# Patient Record
Sex: Female | Born: 1975 | Race: Black or African American | Hispanic: No | Marital: Married | Smoking: Never smoker
Health system: Southern US, Community
[De-identification: ages and names within clinical notes are randomized; demographics above are authoritative.]

## PROBLEM LIST (undated history)

## (undated) DIAGNOSIS — E7800 Pure hypercholesterolemia, unspecified: Secondary | ICD-10-CM

## (undated) DIAGNOSIS — R002 Palpitations: Secondary | ICD-10-CM

## (undated) DIAGNOSIS — E282 Polycystic ovarian syndrome: Secondary | ICD-10-CM

## (undated) HISTORY — DX: Palpitations: R00.2

---

## 1999-09-28 ENCOUNTER — Other Ambulatory Visit: Admission: RE | Admit: 1999-09-28 | Discharge: 1999-09-28 | Payer: Self-pay | Admitting: *Deleted

## 2000-07-19 ENCOUNTER — Other Ambulatory Visit: Admission: RE | Admit: 2000-07-19 | Discharge: 2000-07-19 | Payer: Self-pay | Admitting: *Deleted

## 2001-06-15 ENCOUNTER — Encounter: Admission: RE | Admit: 2001-06-15 | Discharge: 2001-06-20 | Payer: Self-pay | Admitting: Family Medicine

## 2001-07-24 ENCOUNTER — Other Ambulatory Visit: Admission: RE | Admit: 2001-07-24 | Discharge: 2001-07-24 | Payer: Self-pay | Admitting: Family Medicine

## 2002-07-31 ENCOUNTER — Other Ambulatory Visit: Admission: RE | Admit: 2002-07-31 | Discharge: 2002-07-31 | Payer: Self-pay | Admitting: *Deleted

## 2004-06-22 ENCOUNTER — Other Ambulatory Visit: Admission: RE | Admit: 2004-06-22 | Discharge: 2004-06-22 | Payer: Self-pay | Admitting: Gynecology

## 2005-01-10 ENCOUNTER — Emergency Department (HOSPITAL_COMMUNITY): Admission: EM | Admit: 2005-01-10 | Discharge: 2005-01-10 | Payer: Self-pay | Admitting: Emergency Medicine

## 2005-01-23 ENCOUNTER — Ambulatory Visit (HOSPITAL_COMMUNITY): Admission: RE | Admit: 2005-01-23 | Discharge: 2005-01-23 | Payer: Self-pay | Admitting: Orthopedic Surgery

## 2006-01-20 ENCOUNTER — Other Ambulatory Visit: Admission: RE | Admit: 2006-01-20 | Discharge: 2006-01-20 | Payer: Self-pay | Admitting: Gynecology

## 2007-02-05 ENCOUNTER — Other Ambulatory Visit: Admission: RE | Admit: 2007-02-05 | Discharge: 2007-02-05 | Payer: Self-pay | Admitting: Gynecology

## 2008-01-03 DIAGNOSIS — E1169 Type 2 diabetes mellitus with other specified complication: Secondary | ICD-10-CM | POA: Insufficient documentation

## 2008-03-27 ENCOUNTER — Emergency Department (HOSPITAL_COMMUNITY): Admission: EM | Admit: 2008-03-27 | Discharge: 2008-03-27 | Payer: Self-pay | Admitting: Emergency Medicine

## 2008-05-08 ENCOUNTER — Other Ambulatory Visit: Admission: RE | Admit: 2008-05-08 | Discharge: 2008-05-08 | Payer: Self-pay | Admitting: Gynecology

## 2008-12-28 ENCOUNTER — Emergency Department (HOSPITAL_COMMUNITY): Admission: EM | Admit: 2008-12-28 | Discharge: 2008-12-28 | Payer: Self-pay | Admitting: Emergency Medicine

## 2009-01-02 ENCOUNTER — Emergency Department (HOSPITAL_COMMUNITY): Admission: EM | Admit: 2009-01-02 | Discharge: 2009-01-02 | Payer: Self-pay | Admitting: Emergency Medicine

## 2010-12-31 ENCOUNTER — Emergency Department (HOSPITAL_BASED_OUTPATIENT_CLINIC_OR_DEPARTMENT_OTHER)
Admission: EM | Admit: 2010-12-31 | Discharge: 2010-12-31 | Disposition: A | Discharge status: Home / Self Care | Payer: 59 | Attending: Emergency Medicine | Admitting: Emergency Medicine

## 2010-12-31 ENCOUNTER — Emergency Department (INDEPENDENT_AMBULATORY_CARE_PROVIDER_SITE_OTHER): Payer: 59

## 2010-12-31 DIAGNOSIS — R209 Unspecified disturbances of skin sensation: Secondary | ICD-10-CM | POA: Insufficient documentation

## 2010-12-31 DIAGNOSIS — E7800 Pure hypercholesterolemia, unspecified: Secondary | ICD-10-CM | POA: Insufficient documentation

## 2010-12-31 DIAGNOSIS — E119 Type 2 diabetes mellitus without complications: Secondary | ICD-10-CM | POA: Insufficient documentation

## 2010-12-31 DIAGNOSIS — Z79899 Other long term (current) drug therapy: Secondary | ICD-10-CM | POA: Insufficient documentation

## 2010-12-31 LAB — BASIC METABOLIC PANEL
BUN: 11 mg/dL (ref 6–23)
Calcium: 9.4 mg/dL (ref 8.4–10.5)
Chloride: 104 mEq/L (ref 96–112)
GFR calc non Af Amer: >60 mL/min (ref >60)
Glucose, Bld: 169 mg/dL — ABNORMAL HIGH (ref 70–99)

## 2011-01-01 ENCOUNTER — Ambulatory Visit (HOSPITAL_BASED_OUTPATIENT_CLINIC_OR_DEPARTMENT_OTHER)
Admit: 2011-01-01 | Discharge: 2011-01-01 | Disposition: A | Discharge status: Home / Self Care | Payer: 59 | Attending: Endocrinology | Admitting: Endocrinology

## 2011-01-01 DIAGNOSIS — R209 Unspecified disturbances of skin sensation: Secondary | ICD-10-CM | POA: Insufficient documentation

## 2011-01-01 DIAGNOSIS — G510 Bell's palsy: Secondary | ICD-10-CM | POA: Insufficient documentation

## 2011-01-01 DIAGNOSIS — E119 Type 2 diabetes mellitus without complications: Secondary | ICD-10-CM | POA: Insufficient documentation

## 2011-01-01 DIAGNOSIS — E7800 Pure hypercholesterolemia, unspecified: Secondary | ICD-10-CM | POA: Insufficient documentation

## 2011-01-01 MED ORDER — GADOPENTETATE DIMEGLUMINE 469.01 MG/ML IV SOLN
10.0000 mL | Freq: Once | INTRAVENOUS | Status: AC | PRN
  Administered 2011-01-01: 10 mL via INTRAVENOUS

## 2011-03-08 LAB — GLUCOSE, CAPILLARY: Glucose-Capillary: 86 mg/dL (ref 70–99)

## 2012-02-21 ENCOUNTER — Other Ambulatory Visit: Payer: Self-pay | Admitting: Neurology

## 2012-02-21 DIAGNOSIS — R200 Anesthesia of skin: Secondary | ICD-10-CM

## 2012-02-21 DIAGNOSIS — E785 Hyperlipidemia, unspecified: Secondary | ICD-10-CM

## 2012-02-25 ENCOUNTER — Ambulatory Visit
Admission: RE | Admit: 2012-02-25 | Discharge: 2012-02-25 | Disposition: A | Discharge status: Home / Self Care | Payer: 59 | Source: Ambulatory Visit | Attending: Neurology | Admitting: Neurology

## 2012-02-25 DIAGNOSIS — R200 Anesthesia of skin: Secondary | ICD-10-CM

## 2012-02-25 DIAGNOSIS — E785 Hyperlipidemia, unspecified: Secondary | ICD-10-CM

## 2012-07-08 ENCOUNTER — Emergency Department (HOSPITAL_BASED_OUTPATIENT_CLINIC_OR_DEPARTMENT_OTHER): Payer: 59

## 2012-07-08 ENCOUNTER — Encounter (HOSPITAL_BASED_OUTPATIENT_CLINIC_OR_DEPARTMENT_OTHER): Payer: Self-pay | Admitting: Emergency Medicine

## 2012-07-08 ENCOUNTER — Emergency Department (HOSPITAL_BASED_OUTPATIENT_CLINIC_OR_DEPARTMENT_OTHER)
Admission: EM | Admit: 2012-07-08 | Discharge: 2012-07-08 | Disposition: A | Discharge status: Home / Self Care | Payer: 59 | Attending: Emergency Medicine | Admitting: Emergency Medicine

## 2012-07-08 DIAGNOSIS — Y92009 Unspecified place in unspecified non-institutional (private) residence as the place of occurrence of the external cause: Secondary | ICD-10-CM | POA: Insufficient documentation

## 2012-07-08 DIAGNOSIS — Z794 Long term (current) use of insulin: Secondary | ICD-10-CM | POA: Insufficient documentation

## 2012-07-08 DIAGNOSIS — S90129A Contusion of unspecified lesser toe(s) without damage to nail, initial encounter: Secondary | ICD-10-CM | POA: Insufficient documentation

## 2012-07-08 DIAGNOSIS — W1809XA Striking against other object with subsequent fall, initial encounter: Secondary | ICD-10-CM | POA: Insufficient documentation

## 2012-07-08 DIAGNOSIS — E119 Type 2 diabetes mellitus without complications: Secondary | ICD-10-CM | POA: Insufficient documentation

## 2012-07-08 DIAGNOSIS — Y9301 Activity, walking, marching and hiking: Secondary | ICD-10-CM | POA: Insufficient documentation

## 2012-07-08 MED ORDER — NAPROXEN 500 MG PO TABS: 500.0000 mg | ORAL_TABLET | Freq: Two times a day (BID) | ORAL | Status: AC

## 2012-07-08 NOTE — ED Provider Notes (Signed)
History     CSN: [id number]  Arrival date & time 07/08/12  4098   First MD Initiated Contact with Patient 07/08/12 [phone]      Chief Complaint  Patient presents with  . Toe Injury    (Consider location/radiation/quality/duration/timing/severity/associated sxs/prior treatment) HPI Comments: Pt was walking up some stairs last night and tripped on one stair, hurting her left 5th toe.  Has had a constanst, throbbing pain since then.  No other injuries.  Patient is a 36 y.o. female presenting with lower extremity pain. The history is provided by the patient.  Foot Pain This is a new problem. The current episode started yesterday. The problem occurs constantly. The problem has been gradually worsening. Pertinent negatives include no abdominal pain and no shortness of breath. The symptoms are aggravated by walking. Nothing relieves the symptoms. She has tried nothing for the symptoms. The treatment provided no relief.    Past Medical History  Diagnosis Date  . Diabetes mellitus     History reviewed. No pertinent past surgical history.  No family history on file.  History  Substance Use Topics  . Smoking status: Never Smoker   . Smokeless tobacco: Not on file  . Alcohol Use: No    OB History    Grav Para Term Preterm Abortions TAB SAB Ect Mult Living                  Review of Systems  Constitutional: Negative for fever.  HENT: Negative for neck pain.   Respiratory: Negative for shortness of breath.   Gastrointestinal: Negative for abdominal pain.  Musculoskeletal: Negative for back pain.  Skin: Negative for wound.  Neurological: Negative for syncope, weakness and numbness.    Allergies  Review of patient's allergies indicates no known allergies.  Home Medications   Current Outpatient Rx  Name Route Sig Dispense Refill  . INSULIN ASPART 100 UNIT/ML [place] SOLN Subcutaneous Inject 8-10 Units into the skin 3 (three) times daily before meals.    . INSULIN GLARGINE 100  UNIT/ML [place] SOLN Subcutaneous Inject 30 Units into the skin at bedtime.    Marland Kitchen METFORMIN HCL 1000 MG PO TABS Oral Take 1,000 mg by mouth daily with breakfast.    . NAPROXEN 500 MG PO TABS Oral Take 1 tablet (500 mg total) by mouth 2 (two) times daily. 30 tablet 0    BP 146/87  Pulse 65  Temp 98.7 F (37.1 C) (Oral)  Resp 18  SpO2 100%  LMP 06/04/2012  Physical Exam  Constitutional: She appears well-developed and well-nourished.  HENT:  Head: Normocephalic and atraumatic.  Cardiovascular: Normal rate.   Pulmonary/Chest: Effort normal.  Musculoskeletal:       Mild swelling and tenderness to left 5th toe at proximal phalanx.  NV intact.  No wound.  No pain to remainder of foot/ankle  Skin: Skin is warm and dry.    ED Course  Procedures (including critical care time)  Dg Toe 5th Left  07/08/2012  *RADIOLOGY REPORT*  Clinical Data: Toe injury yesterday evening.  Swelling.  Unable to bear weight.  DG TOE 5TH LEFT  Comparison: None  Findings: There is soft tissue swelling of the fifth digit.  No evidence for acute fracture or subluxation.  No radiopaque foreign body or soft tissue gas.  IMPRESSION:  1.  Soft tissue swelling. 2. No evidence for acute osseous abnormality.  Original Report Authenticated By: Patterson Hammersmith, M.D.      1. Toe contusion  MDM  No fx seen, will buddy tape, give post op shoe.  Pt denies need for pain meds, will rx naprosyn        Rolan Bucco, MD 07/08/12 [phone]

## 2012-07-08 NOTE — ED Notes (Signed)
Pt c/o LT 5th toe pain s/p tripping up a step last pm

## 2014-01-22 ENCOUNTER — Other Ambulatory Visit (HOSPITAL_COMMUNITY): Payer: Self-pay | Admitting: Gynecology

## 2014-01-22 DIAGNOSIS — Z3141 Encounter for fertility testing: Secondary | ICD-10-CM

## 2014-01-28 ENCOUNTER — Ambulatory Visit (HOSPITAL_COMMUNITY)
Admission: RE | Admit: 2014-01-28 | Discharge: 2014-01-28 | Disposition: A | Discharge status: Home / Self Care | Payer: 59 | Source: Ambulatory Visit | Attending: Gynecology | Admitting: Gynecology

## 2014-01-28 DIAGNOSIS — Z3141 Encounter for fertility testing: Secondary | ICD-10-CM

## 2014-01-28 DIAGNOSIS — N979 Female infertility, unspecified: Secondary | ICD-10-CM | POA: Insufficient documentation

## 2014-01-28 MED ORDER — IOHEXOL 300 MG/ML  SOLN
10.0000 mL | Freq: Once | INTRAMUSCULAR | Status: AC | PRN
  Administered 2014-01-28: 10 mL

## 2014-12-22 ENCOUNTER — Encounter (HOSPITAL_BASED_OUTPATIENT_CLINIC_OR_DEPARTMENT_OTHER): Payer: Self-pay | Admitting: *Deleted

## 2014-12-22 ENCOUNTER — Emergency Department (HOSPITAL_BASED_OUTPATIENT_CLINIC_OR_DEPARTMENT_OTHER)
Admission: EM | Admit: 2014-12-22 | Discharge: 2014-12-22 | Disposition: A | Discharge status: Home / Self Care | Payer: 59 | Attending: Emergency Medicine | Admitting: Emergency Medicine

## 2014-12-22 DIAGNOSIS — Z794 Long term (current) use of insulin: Secondary | ICD-10-CM | POA: Insufficient documentation

## 2014-12-22 DIAGNOSIS — J029 Acute pharyngitis, unspecified: Secondary | ICD-10-CM | POA: Insufficient documentation

## 2014-12-22 DIAGNOSIS — Z79899 Other long term (current) drug therapy: Secondary | ICD-10-CM | POA: Insufficient documentation

## 2014-12-22 DIAGNOSIS — E119 Type 2 diabetes mellitus without complications: Secondary | ICD-10-CM | POA: Insufficient documentation

## 2014-12-22 LAB — CBG MONITORING, ED: GLUCOSE-CAPILLARY: 72 mg/dL (ref 70–99)

## 2014-12-22 NOTE — ED Provider Notes (Signed)
CSN: [id number]     Arrival date & time 12/22/14  1638 History   First MD Initiated Contact with Patient 12/22/14 1706     Chief Complaint  Patient presents with  . Sore Throat     (Consider location/radiation/quality/duration/timing/severity/associated sxs/prior Treatment) HPI Comments: Patient presents from an outside urgent care with complaint of sore throat. Patient started having a sore throat yesterday. Pain is worse on the left side of her throat. [facility] and had a negative strep test. This was sent for culture. She was referred to the emergency department to rule out peritonsillar abscess. As of her unilateral pain. Patient has not had trouble swelling or change in her voice. She has had a mild cough and a slightly runny nose. No treatments prior to arrival. Patient states that her blood sugars have been controlled. No pain with movement of her neck. No fever, nausea or vomiting.  Patient is a 39 y.o. female presenting with pharyngitis. The history is provided by the patient.  Sore Throat Associated symptoms include coughing and a sore throat. Pertinent negatives include no abdominal pain, chest pain, fever, headaches, myalgias, nausea, rash or vomiting.    Past Medical History  Diagnosis Date  . Diabetes mellitus   . Palpitations    History reviewed. No pertinent past surgical history. No family history on file. History  Substance Use Topics  . Smoking status: Never Smoker   . Smokeless tobacco: Not on file  . Alcohol Use: No   OB History    No data available     Review of Systems  Constitutional: Negative for fever.  HENT: Positive for rhinorrhea and sore throat.   Eyes: Negative for redness.  Respiratory: Positive for cough.   Cardiovascular: Negative for chest pain.  Gastrointestinal: Negative for nausea, vomiting, abdominal pain and diarrhea.  Genitourinary: Negative for dysuria.  Musculoskeletal: Negative for myalgias.  Skin:  Negative for rash.  Neurological: Negative for headaches.      Allergies  Review of patient's allergies indicates no known allergies.  Home Medications   Prior to Admission medications   Medication Sig Start Date End Date Taking? Authorizing Provider  Lorcaserin HCl (BELVIQ PO) Take by mouth.   Yes Historical Provider, MD  Cholecalciferol (VITAMIN D) 2000 UNITS CAPS Take by mouth.    Historical Provider, MD  insulin aspart (NOVOLOG) 100 UNIT/ML injection Inject 8-10 Units into the skin 3 (three) times daily before meals.    Historical Provider, MD  insulin glargine (LANTUS) 100 UNIT/ML injection Inject 30 Units into the skin at bedtime.    Historical Provider, MD  metFORMIN (GLUCOPHAGE) 1000 MG tablet Take 1,000 mg by mouth daily with breakfast.    Historical Provider, MD   BP 181/90 mmHg  Pulse 88  Temp(Src) 98.5 F (36.9 C) (Oral)  Resp 20  Ht 5\' 8"  (1.727 m)  Wt 323 lb 8 oz (146.739 kg)  BMI 49.20 kg/m2  SpO2 100%  LMP 12/08/2014 Physical Exam  Constitutional: She appears well-developed and well-nourished.  HENT:  Head: Normocephalic and atraumatic.  Right Ear: Tympanic membrane, external ear and ear canal normal.  Left Ear: Tympanic membrane, external ear and ear canal normal.  Nose: Mucosal edema present. No rhinorrhea.  Mouth/Throat: Uvula is midline and mucous membranes are normal. Mucous membranes are not dry. No oral lesions. No trismus in the jaw. No uvula swelling. Posterior oropharyngeal erythema present. No oropharyngeal exudate, posterior oropharyngeal edema or tonsillar abscesses.  Eyes: Conjunctivae are  normal. Right eye exhibits no discharge. Left eye exhibits no discharge.  Neck: Normal range of motion. Neck supple.  Cardiovascular: Normal rate, regular rhythm and normal heart sounds.   Pulmonary/Chest: Effort normal and breath sounds normal. No respiratory distress. She has no wheezes. She has no rales.  Abdominal: Soft. There is no tenderness.   Lymphadenopathy:    She has no cervical adenopathy.  Neurological: She is alert.  Skin: Skin is warm and dry.  Psychiatric: She has a normal mood and affect.  Nursing note and vitals reviewed.   ED Course  Procedures (including critical care time) Labs Review Labs Reviewed  CBG MONITORING, ED    Imaging Review No results found.   EKG Interpretation None       5:24 PM Patient seen and examined. No clinical signs of PTA. Patient to use over-the-counter medications as needed for symptom control. Patient to return with worsening pain, fever, trouble swallowing. Patient verbalizes understanding and agrees with plan.    Vital signs reviewed and are as follows: BP 181/90 mmHg  Pulse 88  Temp(Src) 98.5 F (36.9 C) (Oral)  Resp 20  Ht  (1.727 m)  Wt 323 lb 8 oz (146.739 kg)  BMI 49.20 kg/m2  SpO2 100%  LMP 12/08/2014    MDM   Final diagnoses:  Pharyngitis   Patient with no current signs of peritonsillar abscess on exam. No trouble moving neck or fever raising suspicion for retropharyngeal abscess. Signs and symptoms consistent with a viral pharyngitis. Will not recheck a strep test here. Patient appears well, nontoxic. Discharge to home with symptomatic management.   Renne Crigler, PA-C 12/22/14 1727  Arby Barrette, MD 12/28/14 [phone]

## 2014-12-22 NOTE — ED Notes (Signed)
Sore throat since last night. [facility] coming here and had a negative strep that will be sent for a throat culture. They sent her here to r/o peritonsillar abscess.

## 2014-12-22 NOTE — Discharge Instructions (Signed)
Please read and follow all provided instructions.  Your diagnoses today include:  1. Pharyngitis     Tests performed today include:  Vital signs. See below for your results today.   Medications prescribed:   None  Home care instructions:  Please read the educational materials provided and follow any instructions contained in this packet.  Follow-up instructions: Please follow-up with your primary care provider as needed for further evaluation of your symptoms.  Return instructions:   Please return to the Emergency Department if you experience worsening symptoms.   Return if you have worsening problems swallowing, your neck becomes swollen, you cannot swallow your saliva or your voice becomes muffled.   Return with high persistent fever, persistent vomiting, or if you have trouble breathing.   Please return if you have any other emergent concerns.  Additional Information:  Your vital signs today were: BP 181/90 mmHg   Pulse 88   Temp(Src) 98.5 F (36.9 C) (Oral)   Resp 20   Ht 5\' 8"  (1.727 m)   Wt 323 lb 8 oz (146.739 kg)   BMI 49.20 kg/m2   SpO2 100%   LMP 12/08/2014 If your blood pressure (BP) was elevated above 135/85 this visit, please have this repeated by your doctor within one month. --------------

## 2017-05-14 ENCOUNTER — Emergency Department (HOSPITAL_BASED_OUTPATIENT_CLINIC_OR_DEPARTMENT_OTHER)
Admission: EM | Admit: 2017-05-14 | Discharge: 2017-05-14 | Disposition: A | Discharge status: Home / Self Care | Payer: 59 | Attending: Emergency Medicine | Admitting: Emergency Medicine

## 2017-05-14 ENCOUNTER — Emergency Department (HOSPITAL_BASED_OUTPATIENT_CLINIC_OR_DEPARTMENT_OTHER): Payer: 59

## 2017-05-14 ENCOUNTER — Encounter (HOSPITAL_BASED_OUTPATIENT_CLINIC_OR_DEPARTMENT_OTHER): Payer: Self-pay | Admitting: *Deleted

## 2017-05-14 DIAGNOSIS — Z794 Long term (current) use of insulin: Secondary | ICD-10-CM | POA: Diagnosis not present

## 2017-05-14 DIAGNOSIS — Z79899 Other long term (current) drug therapy: Secondary | ICD-10-CM | POA: Diagnosis not present

## 2017-05-14 DIAGNOSIS — M25552 Pain in left hip: Secondary | ICD-10-CM | POA: Diagnosis not present

## 2017-05-14 DIAGNOSIS — E119 Type 2 diabetes mellitus without complications: Secondary | ICD-10-CM | POA: Insufficient documentation

## 2017-05-14 DIAGNOSIS — M79605 Pain in left leg: Secondary | ICD-10-CM

## 2017-05-14 HISTORY — DX: Polycystic ovarian syndrome: E28.2

## 2017-05-14 HISTORY — DX: Pure hypercholesterolemia, unspecified: E78.00

## 2017-05-14 LAB — CBC WITH DIFFERENTIAL/PLATELET
BASOS ABS: 0 10*3/uL (ref 0.0–0.1)
BASOS PCT: 0 %
EOS PCT: 1 %
Eosinophils Absolute: 0.1 10*3/uL (ref 0.0–0.7)
HEMATOCRIT: 39 % (ref 36.0–46.0)
Hemoglobin: 13.1 g/dL (ref 12.0–15.0)
LYMPHS PCT: 26 %
Lymphs Abs: 2.5 10*3/uL (ref 0.7–4.0)
MCH: 27.2 pg (ref 26.0–34.0)
MCHC: 33.6 g/dL (ref 30.0–36.0)
MCV: 80.9 fL (ref 78.0–100.0)
Monocytes Absolute: 0.6 10*3/uL (ref 0.1–1.0)
Monocytes Relative: 6 %
NEUTROS ABS: 6.5 10*3/uL (ref 1.7–7.7)
Neutrophils Relative %: 67 %
PLATELETS: 301 10*3/uL (ref 150–400)
RBC: 4.82 MIL/uL (ref 3.87–5.11)
RDW: 14.8 % (ref 11.5–15.5)
WBC: 9.8 10*3/uL (ref 4.0–10.5)

## 2017-05-14 LAB — BASIC METABOLIC PANEL
ANION GAP: 8 (ref 5–15)
BUN: 20 mg/dL (ref 6–20)
CO2: 24 mmol/L (ref 22–32)
Calcium: 9 mg/dL (ref 8.9–10.3)
Chloride: 105 mmol/L (ref 101–111)
Creatinine, Ser: 0.95 mg/dL (ref 0.44–1.00)
GFR calc Af Amer: >60 mL/min (ref >60)
Glucose, Bld: 116 mg/dL — ABNORMAL HIGH (ref 65–99)
POTASSIUM: 4.5 mmol/L (ref 3.5–5.1)
SODIUM: 137 mmol/L (ref 135–145)

## 2017-05-14 LAB — URINALYSIS, ROUTINE W REFLEX MICROSCOPIC
Bilirubin Urine: NEGATIVE
Glucose, UA: >=500 mg/dL — AB
Hgb urine dipstick: NEGATIVE
Ketones, ur: NEGATIVE mg/dL
LEUKOCYTES UA: NEGATIVE
NITRITE: NEGATIVE
PROTEIN: NEGATIVE mg/dL
Specific Gravity, Urine: 1.031 — ABNORMAL HIGH (ref 1.005–1.030)
pH: 6 (ref 5.0–8.0)

## 2017-05-14 LAB — URINALYSIS, MICROSCOPIC (REFLEX)

## 2017-05-14 LAB — PREGNANCY, URINE: PREG TEST UR: NEGATIVE

## 2017-05-14 MED ORDER — DIAZEPAM 5 MG PO TABS: 2.5000 mg | ORAL_TABLET | Freq: Two times a day (BID) | ORAL | 0 refills | Status: AC | PRN

## 2017-05-14 MED ORDER — KETOROLAC TROMETHAMINE 30 MG/ML IJ SOLN
15.0000 mg | Freq: Once | INTRAMUSCULAR | Status: AC
  Administered 2017-05-14: 15 mg via INTRAVENOUS
  Filled 2017-05-14: qty 1

## 2017-05-14 MED ORDER — FENTANYL CITRATE (PF) 100 MCG/2ML IJ SOLN
100.0000 ug | Freq: Once | INTRAMUSCULAR | Status: AC
  Administered 2017-05-14: 100 ug via INTRAVENOUS
  Filled 2017-05-14: qty 2

## 2017-05-14 MED ORDER — HYDROCODONE-ACETAMINOPHEN 5-325 MG PO TABS: 1.0000 | ORAL_TABLET | Freq: Two times a day (BID) | ORAL | 0 refills | Status: AC | PRN

## 2017-05-14 MED ORDER — HYDROCODONE-ACETAMINOPHEN 5-325 MG PO TABS
2.0000 | ORAL_TABLET | Freq: Once | ORAL | Status: AC
  Administered 2017-05-14: 2 via ORAL
  Filled 2017-05-14: qty 2

## 2017-05-14 MED ORDER — IBUPROFEN 400 MG PO TABS: 400.0000 mg | ORAL_TABLET | Freq: Four times a day (QID) | ORAL | 0 refills | Status: AC

## 2017-05-14 NOTE — ED Notes (Signed)
EDP into room 

## 2017-05-14 NOTE — ED Triage Notes (Signed)
C/o L upper antero/lateral (vastus lateralis) pain, radiates to lower lateral thigh. Rates 7/10 pain. Denies back, hip or buttocks pain. Denies weakness, numbness or tingling. Denies urinary or vaginal sx. Onset yesterday, worse this morning. Mentions chiropractic back adjustment last week. Mentions medial thigh cramping on Saturday morning. Mentions wore wedge heals recently.   Alert, NAD, calm, interactive, resps e/u, speaking in clear complete sentences, no dyspnea noted, skin W&D. [facility]

## 2017-05-14 NOTE — ED Provider Notes (Signed)
[facility] Provider Note   CSN: [id number] Arrival date & time: 05/14/17  0545     History   Chief Complaint Chief Complaint  Patient presents with  . Leg Pain    HPI Jacqueline Cohen is a 41 y.o. female.  The history is provided by the patient and the spouse.  Leg Pain   This is a new problem. The current episode started 12 to 24 hours ago. The problem occurs constantly. The problem has been gradually worsening. The pain is present in the left hip. The pain is severe. Associated symptoms include limited range of motion.  Patient reports left hip/pelvic bone pain She reports she was seen by chiropractor recently for pain in that area, she "got an adjustment" and felt improved 2 days ago she wore high heels, and the next day she had significant pain in left hip/pelvic area.  No trauma It is now so severe she can not walk She has nausea No abd pain  no back pain  No leg weakness  Past Medical History:  Diagnosis Date  . Diabetes mellitus   . Hypercholesterolemia -  . Palpitations   . PCOS (polycystic ovarian syndrome)     There are no active problems to display for this patient.   History reviewed. No pertinent surgical history.  OB History    No data available       Home Medications    Prior to Admission medications   Medication Sig Start Date End Date Taking? Authorizing Provider  Cholecalciferol (VITAMIN D) 2000 UNITS CAPS Take by mouth.    [provider]  insulin aspart (NOVOLOG) 100 UNIT/ML injection Inject 8-10 Units into the skin 3 (three) times daily before meals.    [provider]  insulin glargine (LANTUS) 100 UNIT/ML injection Inject 30 Units into the skin at bedtime.    [provider]  Lorcaserin HCl (BELVIQ PO) Take by mouth.    [provider]  metFORMIN (GLUCOPHAGE) 1000 MG tablet Take 1,000 mg by mouth daily with breakfast.    [provider]    Family History No family history on  file.  Social History Social History  Substance Use Topics  . Smoking status: Never Smoker  . Smokeless tobacco: Never Used  . Alcohol use No     Allergies   Doxycycline   Review of Systems Review of Systems  Constitutional: Negative for fever.  Gastrointestinal: Negative for abdominal pain.  Musculoskeletal: Positive for arthralgias and gait problem. Negative for back pain.  Neurological: Negative for weakness.  All other systems reviewed and are negative.    Physical Exam Updated Vital Signs BP (!) 141/82 (BP Location: Right Arm)   Pulse 70   Temp 98.3 F (36.8 C) (Oral)   Resp 18   Ht 1.727 m (5\' 8" )   Wt (!) 145.2 kg (320 lb)   LMP 12/14/2016 (Approximate) Comment: h/o PCOS  SpO2 99%   BMI 48.66 kg/m   Physical Exam CONSTITUTIONAL: Uncomfortable appearing HEAD: Normocephalic/atraumatic EYES: EOMI/PERRL ENMT: Mucous membranes moist NECK: supple no meningeal signs SPINE/BACK:entire spine nontender CV: S1/S2 noted, no murmurs/rubs/gallops noted LUNGS: Lungs are clear to auscultation bilaterally, no apparent distress ABDOMEN: obese soft, nontender, no rebound or guarding GU:no cva tenderness NEURO: Pt is awake/alert/appropriate, moves all extremitiesx4.  No facial droop.   EXTREMITIES: pulses normal/equal in bilateral LE, exquisite tenderness elicited with ROM of left hip.  No deformities. Tenderness to deep palpation of left inguinal region, no erythema/abscess noted.  No  thrill noted.  Female nurse Chanin present for exam  SKIN: warm, color normal PSYCH: no abnormalities of mood noted, alert and oriented to situation   ED Treatments / Results  Labs (all labs ordered are listed, but only abnormal results are displayed) Labs Reviewed  URINALYSIS, ROUTINE W REFLEX MICROSCOPIC - Abnormal; Notable for the following:       Result Value   Specific Gravity, Urine 1.031 (*)    Glucose, UA >=500 (*)    All other components within normal limits  URINALYSIS,  MICROSCOPIC (REFLEX) - Abnormal; Notable for the following:    Bacteria, UA MANY (*)    Squamous Epithelial / LPF 6-30 (*)    All other components within normal limits  PREGNANCY, URINE  BASIC METABOLIC PANEL  CBC WITH DIFFERENTIAL/PLATELET    EKG  EKG Interpretation None       Radiology No results found.  Procedures Procedures (including critical care time)  Medications Ordered in ED Medications  fentaNYL (SUBLIMAZE) injection 100 mcg (100 mcg Intravenous Given 05/14/17 0727)     Initial Impression / Assessment and Plan / ED Course  I have reviewed the triage vital signs and the nursing notes.  Pertinent labs & imaging results that were available during my care of the patient were reviewed by me and considered in my medical decision making (see chart for details).     7:30 AM Pt presents with atraumatic left hip pain  She has significant tenderness with ROM of left hip Due to her body size, exam was very difficult to distinguish between abdominal/pelvic pain vs. Bony tenderness Initial labs/imaging ordered 7:41 AM Pt with some improvement in pain ,but now reports dizziness due to meds Labs/imaging pending At signout to dr Clayborne Danamesner, f/u on labs/imaging If pt improved/ambulatory she may be discharged If any difficulty walking she may need further testing such as CT imaging   Final Clinical Impressions(s) / ED Diagnoses   Final diagnoses:  Left hip pain    New Prescriptions New Prescriptions   No medications on file     Zadie RhineWickline, Alexa Golebiewski, MD 05/14/17 [phone]

## 2019-01-16 IMAGING — CR DG HIP (WITH OR WITHOUT PELVIS) 2-3V*L*
3 series · 3 of 3 positions shown · non-contrast
Comparison: None.

CLINICAL DATA: preg test C/o L upper antero/lateral (vastus
lateralis) pain, radiates to lower lateral thigh. Rates [DATE] pain.
Denies back, hip or buttocks pain. Denies weakness, numbness or
tingling. Denies urinary or vaginal sx.

EXAM:
DG HIP (WITH OR WITHOUT PELVIS) 2-3V LEFT

[t pelvis a.p. *]
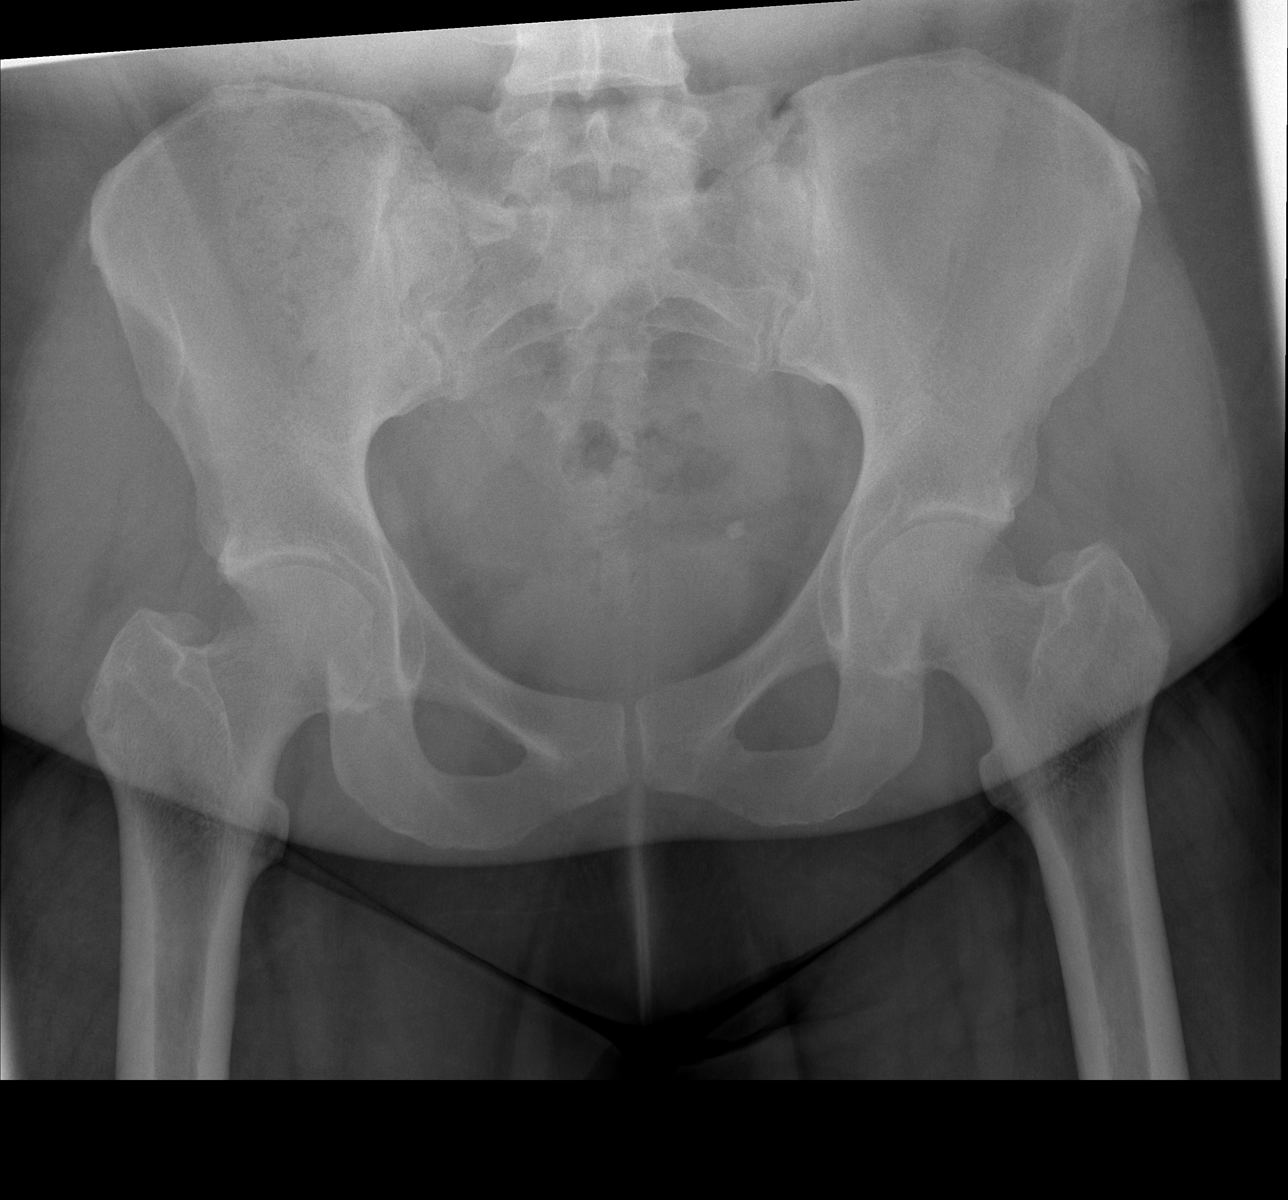

[t hip ap left *]
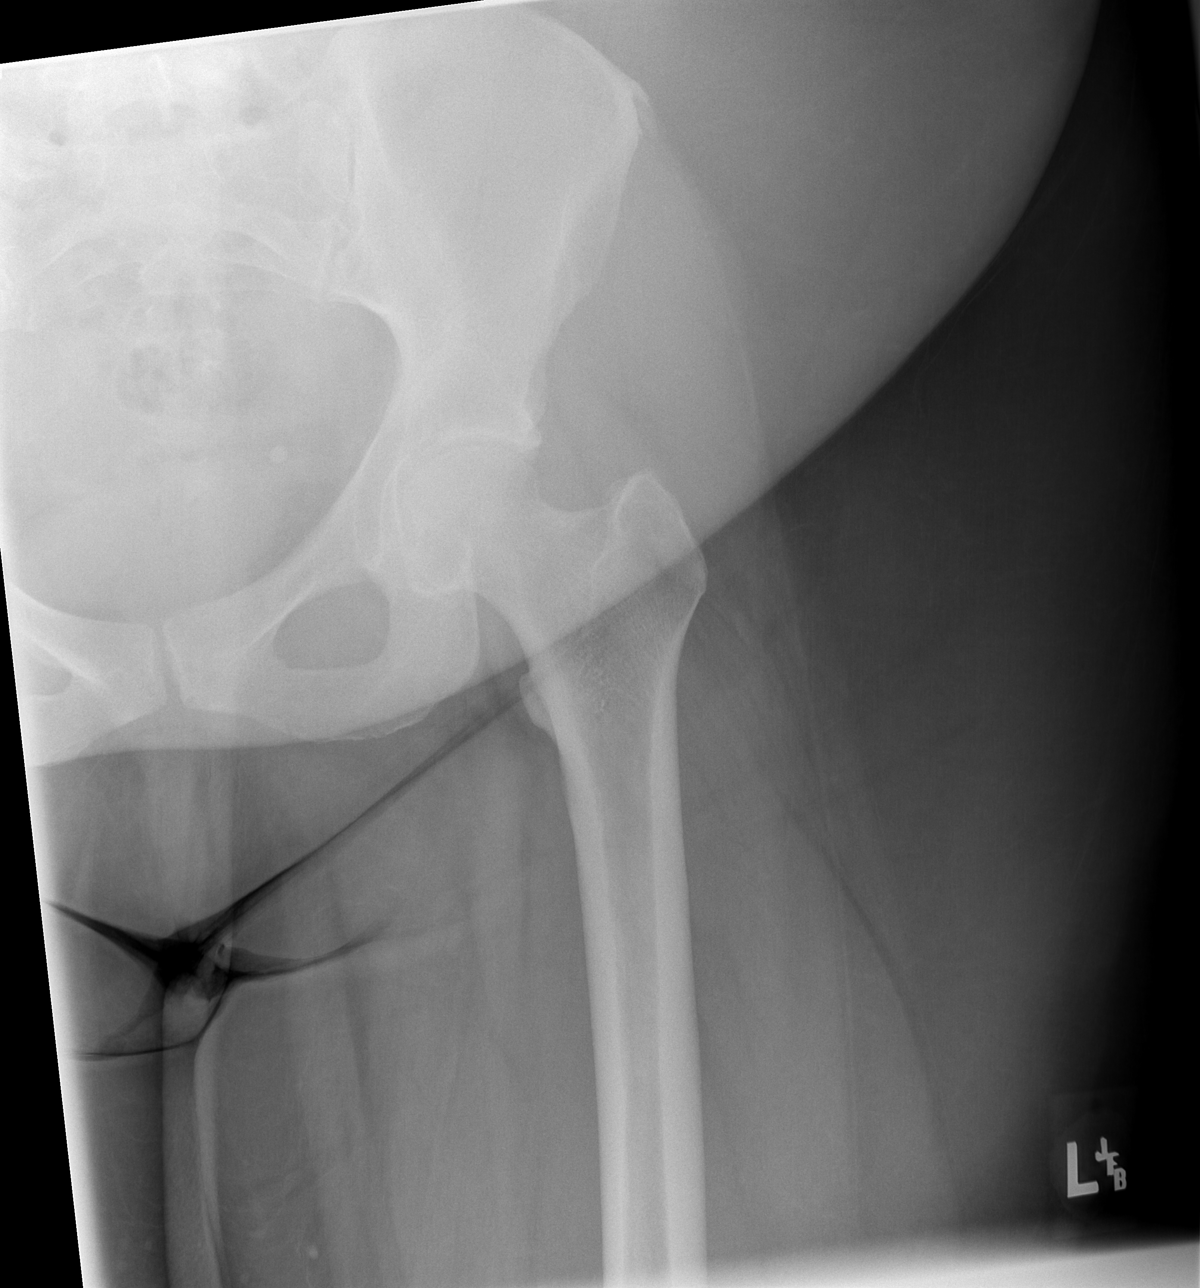

[t hip frog leg left *]
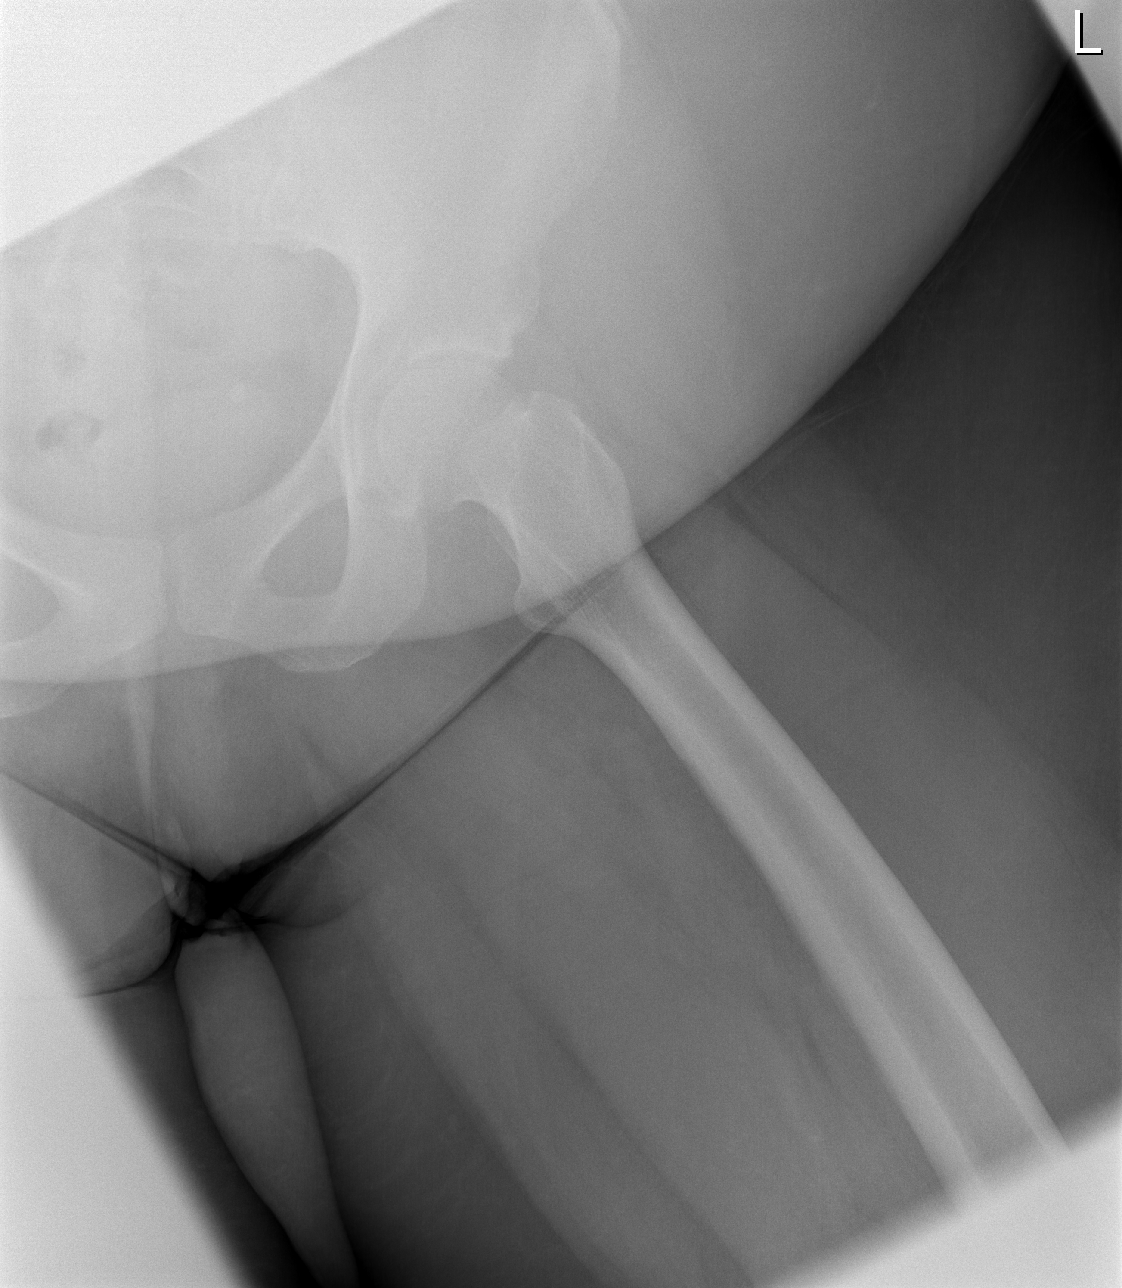

[3 of 3 positions shown; findings below may reference images not displayed]

FINDINGS: Hips are located. No evidence of pelvic fracture or sacral fracture.
Dedicated view of the LEFT hip demonstrates no femoral neck
fracture.
IMPRESSION: No fracture or dislocation.

## 2022-03-08 DIAGNOSIS — E669 Obesity, unspecified: Secondary | ICD-10-CM | POA: Insufficient documentation

## 2022-03-08 DIAGNOSIS — E7800 Pure hypercholesterolemia, unspecified: Secondary | ICD-10-CM | POA: Insufficient documentation

## 2022-06-22 ENCOUNTER — Ambulatory Visit: Payer: 59 | Attending: Orthopedic Surgery | Admitting: Physical Therapy

## 2022-06-22 ENCOUNTER — Encounter: Payer: Self-pay | Admitting: Physical Therapy

## 2022-06-22 DIAGNOSIS — R600 Localized edema: Secondary | ICD-10-CM | POA: Insufficient documentation

## 2022-06-22 DIAGNOSIS — R2689 Other abnormalities of gait and mobility: Secondary | ICD-10-CM | POA: Insufficient documentation

## 2022-06-22 DIAGNOSIS — R262 Difficulty in walking, not elsewhere classified: Secondary | ICD-10-CM | POA: Insufficient documentation

## 2022-06-22 DIAGNOSIS — M6281 Muscle weakness (generalized): Secondary | ICD-10-CM | POA: Insufficient documentation

## 2022-06-22 DIAGNOSIS — R278 Other lack of coordination: Secondary | ICD-10-CM | POA: Insufficient documentation

## 2022-06-22 NOTE — Therapy (Signed)
OUTPATIENT PHYSICAL THERAPY LOWER EXTREMITY EVALUATION   Patient Name: Jacqueline Cohen MRN: [id number] DOB:03/16/1976, 46 y.o., female Today's Date: 06/22/2022   PT End of Session - 06/22/22 1649     Visit Number 1    Date for PT Re-Evaluation 08/31/22    PT Start Time 1547    PT Stop Time 1628    PT Time Calculation (min) 41 min    Activity Tolerance Patient tolerated treatment well    Behavior During Therapy [facility] for tasks assessed/performed             Past Medical History:  Diagnosis Date   Diabetes mellitus    Hypercholesterolemia -   Palpitations    PCOS (polycystic ovarian syndrome)    History reviewed. No pertinent surgical history. There are no problems to display for this patient.   PCP: [facility]  REFERRING PROVIDER: Daws, Harlow Mares, MD   REFERRING DIAG: Achilles Tendonosis   THERAPY DIAG:  Muscle weakness (generalized)  Difficulty in walking, not elsewhere classified  Localized edema  Other lack of coordination  Other abnormalities of gait and mobility  Rationale for Evaluation and Treatment Rehabilitation  ONSET DATE: 06/02/2022   SUBJECTIVE:   SUBJECTIVE STATEMENT: Pain started 4/23 after walking on a treadmill on an incline. She tried a shorter boot, but in June she was issued a taller boot which she has worn since then.  PERTINENT HISTORY: DM  PAIN:  Are you having pain? Yes: NPRS scale: 7/10 Pain location: L achilles Pain description: stretching Aggravating factors: first thing in the morning Relieving factors: warms up , also icing helps  PRECAUTIONS: None  WEIGHT BEARING RESTRICTIONS No  FALLS:  Has patient fallen in last 6 months? 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  PLOF: Independent  PATIENT GOALS Get out of the boot, return to her normal self.   OBJECTIVE:   DIAGNOSTIC FINDINGS: Xray 03/08/22-No acute osseous abnormalities  PATIENT SURVEYS:  FOTO 43.8  COGNITION:  Overall cognitive status: Within functional limits for tasks assessed     SENSATION: Not tested  POSTURE: No Significant postural limitations  PALPATION: Patient was TTP along entire calf, with increased discomfort at distal achilles tendon to distal/posterior calcaneus.   LOWER EXTREMITY ROM:  Passive ROM Right eval Left eval  Hip flexion    Hip extension    Hip abduction    Hip adduction    Hip internal rotation    Hip external rotation    Knee flexion    Knee extension    Ankle dorsiflexion  AROM 80 90 PROM  Ankle plantarflexion  38/30  Ankle inversion  WFL  Ankle eversion  WFL   (Blank rows = not tested)  LOWER EXTREMITY MMT: WNL BLE except L ankle approximately (2+)-(3-)/5 in all planes   GAIT: Distance walked: 79' Assistive device utilized: None Level of assistance: Complete Independence Comments: Wearing CAM Boot on L. Reports L hip pain, limited gait due to boot.    TODAY'S TREATMENT: Paitent education, AROM or L ankle in all planes   PATIENT EDUCATION:  Education details: Basic HEP, POC, begin to wean from the boot, starting in the house only, wearing tennis shoes. Progress as tolerated. Person educated: Patient Education method: Explanation, Demonstration, and  Verbal cues Education comprehension: verbalized understanding and returned demonstration   HOME EXERCISE PROGRAM:  [id number]  ASSESSMENT:  CLINICAL IMPRESSION: Patient is a 46 y.o. who was seen today for physical therapy evaluation and treatment for L achilles tenodesis. She has been wearing a CAM boot for nearly 2 months. She has a follow up appointment scheduled with her Dr on 8/17. 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 She will benfit from PT to address her pain, weakness, and stiffness in order to return to her PLOF.   OBJECTIVE IMPAIRMENTS Abnormal gait, decreased balance, decreased coordination, decreased endurance, decreased mobility, difficulty walking, decreased ROM, decreased strength, increased fascial restrictions, impaired flexibility, and pain.   ACTIVITY LIMITATIONS bending, standing, squatting, sleeping, stairs, locomotion level, and caring for others  PARTICIPATION LIMITATIONS: cleaning, laundry, shopping, community activity, occupation, and yard work  PERSONAL FACTORS Past/current experiences and 1 comorbidity: DM  are also affecting patient's functional outcome.   REHAB POTENTIAL: Good  CLINICAL DECISION MAKING: Evolving/moderate complexity  EVALUATION COMPLEXITY: Moderate   GOALS: Goals reviewed with patient? Yes  SHORT TERM GOALS: Target date: 07/20/2022  I with basic HEP Baseline: Goal status: INITIAL  LONG TERM GOALS: Target date: 08/31/2022   I with final HEP Baseline:  Goal status: INITIAL  2.  Patient will achieve full, painfree A and PROM in L ankle. Baseline:  Goal status: INITIAL  3.  Patient will achieve L ankle strength of at least 4/5 in all planes. Baseline:  Goal status: INITIAL  4.  Patient will walk at least 1000' on level and unlevel surfaces with L achilles pain of <3/10 Baseline:  Goal status: INITIAL  5.  Patient will tolerate a full day of work with L achilles pain < 3/10 Baseline:  Goal status: INITIAL  6. Patient will climb up and down at least 12 steps using step over step with U rail, MI, L achilles pain < 3/10 Baseline:  Goal Status: INITIAL   7. Increase FOTO score to at least 65  Baseline:  Goal Status: INITIAL PLAN: PT FREQUENCY: 1-2x/week  PT DURATION: 10 weeks  PLANNED INTERVENTIONS: Therapeutic exercises, Therapeutic activity, Neuromuscular re-education,  Balance training, Gait training, Patient/Family education, Self Care, Joint mobilization, Stair training, Dry Needling, Electrical stimulation, Cryotherapy, Moist heat, Vasopneumatic device, Ultrasound, Ionotophoresis 4mg /ml Dexamethasone, and Manual therapy  PLAN FOR NEXT SESSION: STM, stretch, strengthening, update Hep to include resisted strengthening.   , DPT 06/22/2022, 4:51 PM

## 2022-07-04 ENCOUNTER — Ambulatory Visit: Payer: 59 | Admitting: Physical Therapy

## 2022-07-04 ENCOUNTER — Encounter: Payer: Self-pay | Admitting: Physical Therapy

## 2022-07-04 DIAGNOSIS — M6281 Muscle weakness (generalized): Secondary | ICD-10-CM | POA: Diagnosis not present

## 2022-07-04 DIAGNOSIS — R262 Difficulty in walking, not elsewhere classified: Secondary | ICD-10-CM

## 2022-07-04 DIAGNOSIS — R278 Other lack of coordination: Secondary | ICD-10-CM

## 2022-07-04 DIAGNOSIS — R2689 Other abnormalities of gait and mobility: Secondary | ICD-10-CM

## 2022-07-04 DIAGNOSIS — R600 Localized edema: Secondary | ICD-10-CM

## 2022-07-04 NOTE — Therapy (Signed)
OUTPATIENT PHYSICAL THERAPY LOWER EXTREMITY EVALUATION   Patient Name: Jacqueline Cohen MRN: [id number] DOB:07/22/1976, 46 y.o., female Today's Date: 07/04/2022   PT End of Session - 07/04/22 1722     Visit Number 2    Date for PT Re-Evaluation 08/31/22    PT Start Time 1717    PT Stop Time 1756    PT Time Calculation (min) 39 min    Activity Tolerance Patient tolerated treatment well    Behavior During Therapy WFL for tasks assessed/performed              Past Medical History:  Diagnosis Date   Diabetes mellitus    Hypercholesterolemia -   Palpitations    PCOS (polycystic ovarian syndrome)    History reviewed. No pertinent surgical history. There are no problems to display for this patient.   PCP: [facility]  REFERRING PROVIDER: Daws, Harlow Mares, MD   REFERRING DIAG: Achilles Tendonosis   THERAPY DIAG:  Muscle weakness (generalized)  Difficulty in walking, not elsewhere classified  Localized edema  Other abnormalities of gait and mobility  Other lack of coordination  Rationale for Evaluation and Treatment Rehabilitation  ONSET DATE: 06/02/2022   SUBJECTIVE:   SUBJECTIVE STATEMENT: Patient reports she has had some calf cramping with her HEP, but able to work through it. She is weaning off of the CAM Boot. She wears tennis shoes at home, and puts the boot back on if she starts getting some burning. Max pain of 4/10  PERTINENT HISTORY: DM  PAIN:  Are you having pain? Yes: NPRS scale: 7/10 Pain location: L achilles Pain description: stretching Aggravating factors: first thing in the morning Relieving factors: warms up , also icing helps  PRECAUTIONS: None  WEIGHT BEARING RESTRICTIONS No  FALLS:  Has patient fallen in last 6 months? 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  PLOF: Independent  PATIENT GOALS Get out of the boot, return to her normal self.   OBJECTIVE:   DIAGNOSTIC FINDINGS: Xray 03/08/22-No acute osseous abnormalities  PATIENT SURVEYS:  FOTO 43.8  COGNITION:  Overall cognitive status: Within functional limits for tasks assessed     SENSATION: Not tested  POSTURE: No Significant postural limitations  PALPATION: Patient was TTP along entire calf, with increased discomfort at distal achilles tendon to distal/posterior calcaneus.   LOWER EXTREMITY ROM:  Passive ROM Right eval Left eval  Hip flexion    Hip extension    Hip abduction    Hip adduction    Hip internal rotation    Hip external rotation    Knee flexion    Knee extension    Ankle dorsiflexion  AROM 80 90 PROM  Ankle plantarflexion  38/30  Ankle inversion  WFL  Ankle eversion  WFL   (Blank rows = not tested)  LOWER EXTREMITY MMT: WNL BLE except L ankle approximately (2+)-(3-)/5 in all planes   GAIT: Distance walked: 26' Assistive device utilized: None Level of assistance: Complete Independence Comments: Wearing CAM Boot on L. Reports L hip pain, limited gait due to boot.    TODAY'S TREATMENT: 07/04/22 NuStep L5 x 6 minutes STM to L calf and achilles tendon followed by stretch to DF, toe flexion and ext, calcaneal inversion Active ankle strengthening-DF,  PF, Inver, Ever 10 reps each against Red Tband resistance Ankle inversion eversion on a towel on the floor. 10 reps  Eval Patient education, AROM or L ankle in all planes   PATIENT EDUCATION:  Education details: Basic HEP, POC, begin to wean from the boot, starting in the house only, wearing tennis shoes. Progress as tolerated. Person educated: Patient Education method: Explanation, Demonstration, and Verbal cues Education comprehension: verbalized understanding and returned  demonstration   HOME EXERCISE PROGRAM:  [id number]  ASSESSMENT:  CLINICAL IMPRESSION: Patient reports improved mobility in the ankle. Treatment focused on initiating strengthening for the ankle and foot along with STM and stretch. She toelrated new exercises, encouraged to ice ankle as needed after treatment.   OBJECTIVE IMPAIRMENTS Abnormal gait, decreased balance, decreased coordination, decreased endurance, decreased mobility, difficulty walking, decreased ROM, decreased strength, increased fascial restrictions, impaired flexibility, and pain.   ACTIVITY LIMITATIONS bending, standing, squatting, sleeping, stairs, locomotion level, and caring for others  PARTICIPATION LIMITATIONS: cleaning, laundry, shopping, community activity, occupation, and yard work  PERSONAL FACTORS Past/current experiences and 1 comorbidity: DM  are also affecting patient's functional outcome.   REHAB POTENTIAL: Good  CLINICAL DECISION MAKING: Evolving/moderate complexity  EVALUATION COMPLEXITY: Moderate   GOALS: Goals reviewed with patient? Yes  SHORT TERM GOALS: Target date: 07/20/2022  I with basic HEP Baseline: Goal status: INITIAL  LONG TERM GOALS: Target date: 08/31/2022   I with final HEP Baseline:  Goal status: INITIAL  2.  Patient will achieve full, painfree A and PROM in L ankle. Baseline:  Goal status: INITIAL  3.  Patient will achieve L ankle strength of at least 4/5 in all planes. Baseline:  Goal status: INITIAL  4.  Patient will walk at least 1000' on level and unlevel surfaces with L achilles pain of <3/10 Baseline:  Goal status: INITIAL  5.  Patient will tolerate a full day of work with L achilles pain < 3/10 Baseline:  Goal status: INITIAL  6. Patient will climb up and down at least 12 steps using step over step with U rail, MI, L achilles pain < 3/10 Baseline:  Goal Status: INITIAL   7. Increase FOTO score to at least 65  Baseline:  Goal Status: INITIAL PLAN: PT  FREQUENCY: 1-2x/week  PT DURATION: 10 weeks  PLANNED INTERVENTIONS: Therapeutic exercises, Therapeutic activity, Neuromuscular re-education, Balance training, Gait training, Patient/Family education, Self Care, Joint mobilization, Stair training, Dry Needling, Electrical stimulation, Cryotherapy, Moist heat, Vasopneumatic device, Ultrasound, Ionotophoresis 4mg /ml Dexamethasone, and Manual therapy  PLAN FOR NEXT SESSION: STM, stretch, strengthening, update Hep to include resisted strengthening.   , DPT 07/04/2022, 5:55 PM

## 2022-07-06 ENCOUNTER — Ambulatory Visit: Payer: 59 | Admitting: Physical Therapy

## 2022-07-06 ENCOUNTER — Encounter: Payer: Self-pay | Admitting: Physical Therapy

## 2022-07-06 DIAGNOSIS — M6281 Muscle weakness (generalized): Secondary | ICD-10-CM | POA: Diagnosis not present

## 2022-07-06 DIAGNOSIS — R600 Localized edema: Secondary | ICD-10-CM

## 2022-07-06 DIAGNOSIS — R262 Difficulty in walking, not elsewhere classified: Secondary | ICD-10-CM

## 2022-07-06 NOTE — Therapy (Signed)
OUTPATIENT PHYSICAL THERAPY LOWER EXTREMITY EVALUATION   Patient Name: Jacqueline Cohen MRN: [id number] DOB:17-May-1976, 46 y.o., female Today's Date: 07/06/2022   PT End of Session - 07/06/22 0932     Visit Number 3    Date for PT Re-Evaluation 08/31/22    PT Start Time 0930    PT Stop Time 1015    PT Time Calculation (min) 45 min    Activity Tolerance Patient tolerated treatment well    Behavior During Therapy [facility] for tasks assessed/performed              Past Medical History:  Diagnosis Date   Diabetes mellitus    Hypercholesterolemia -   Palpitations    PCOS (polycystic ovarian syndrome)    History reviewed. No pertinent surgical history. There are no problems to display for this patient.   [facility]P: [facility]  REFERRING PROVIDER: Daws, Harlow Mares, MD   REFERRING DIAG: Achilles Tendonosis   THERAPY DIAG:  Muscle weakness (generalized)  Difficulty in walking, not elsewhere classified  Localized edema  Rationale for Evaluation and Treatment Rehabilitation  ONSET DATE: 06/02/2022   SUBJECTIVE:   SUBJECTIVE STATEMENT: "I am good" Hurt to put pressure on her ankle this morning but that has improved  PERTINENT HISTORY: DM  PAIN:  Are you having pain? Yes: NPRS scale: 3/10 Pain location: L achilles Pain description: stretching Aggravating factors: first thing in the morning Relieving factors: warms up , also icing helps  FALLS:  Has patient fallen in last 6 months? 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  PLOF: Independent  PATIENT GOALS Get out of the boot, return to her normal self.   OBJECTIVE:   DIAGNOSTIC FINDINGS: Xray 03/08/22-No acute osseous  abnormalities  PATIENT SURVEYS:  FOTO 43.8     POSTURE: No Significant postural limitations  PALPATION: Patient was TTP along entire calf, with increased discomfort at distal achilles tendon to distal/posterior calcaneus.   LOWER EXTREMITY ROM:  Passive ROM Right eval Left eval  Hip flexion    Hip extension    Hip abduction    Hip adduction    Hip internal rotation    Hip external rotation    Knee flexion    Knee extension    Ankle dorsiflexion  AROM 80 90 PROM  Ankle plantarflexion  38/30  Ankle inversion  WFL  Ankle eversion  WFL   (Blank rows = not tested)  LOWER EXTREMITY MMT: WNL BLE except L ankle approximately (2+)-(3-)/5 in all planes   GAIT: Distance walked: 62' Assistive device utilized: None Level of assistance: Complete Independence Comments: Wearing CAM Boot on L. Reports L hip pain, limited gait due to boot.    TODAY'S TREATMENT: 07/06/2022 Recumbent bike L2.5 x 6 min  L ankle PROM in all directions L ankle 4 way green x10 S2S 2x10  Resisted gait forward 20lb x3 ankle burning  Alt box taps 4in 2x10  Side step   Ionto Medial L ankle 4 hour patch   07/04/22 NuStep L5 x 6 minutes STM to L calf and achilles tendon followed by stretch to DF, toe flexion and ext, calcaneal inversion Active ankle strengthening-DF, PF, Inver, Ever 10 reps each against Red Tband resistance Ankle inversion eversion on a towel on  the floor. 10 reps  Eval Patient education, AROM or L ankle in all planes   PATIENT EDUCATION:  Education details: Basic HEP, POC, begin to wean from the boot, starting in the house only, wearing tennis shoes. Progress as tolerated. Person educated: Patient Education method: Explanation, Demonstration, and Verbal cues Education comprehension: verbalized understanding and returned demonstration   HOME EXERCISE PROGRAM:  [id number]  ASSESSMENT:  CLINICAL IMPRESSION: Patient reports improved mobility in the ankle. Treatment focused a  progression of strength. Some L ankle burning reported after resisted gait. Pt has some facial expression with resisted eversion. Cues for equal weight distribution needed with sit to stands. Ionto for pain.   OBJECTIVE IMPAIRMENTS Abnormal gait, decreased balance, decreased coordination, decreased endurance, decreased mobility, difficulty walking, decreased ROM, decreased strength, increased fascial restrictions, impaired flexibility, and pain.   ACTIVITY LIMITATIONS bending, standing, squatting, sleeping, stairs, locomotion level, and caring for others  PARTICIPATION LIMITATIONS: cleaning, laundry, shopping, community activity, occupation, and yard work  PERSONAL FACTORS Past/current experiences and 1 comorbidity: DM  are also affecting patient's functional outcome.   REHAB POTENTIAL: Good  CLINICAL DECISION MAKING: Evolving/moderate complexity  EVALUATION COMPLEXITY: Moderate   GOALS: Goals reviewed with patient? Yes  SHORT TERM GOALS: Target date: 07/20/2022  I with basic HEP Baseline: Goal status: INITIAL  LONG TERM GOALS: Target date: 08/31/2022   I with final HEP Baseline:  Goal status: INITIAL  2.  Patient will achieve full, painfree A and PROM in L ankle. Baseline:  Goal status: INITIAL  3.  Patient will achieve L ankle strength of at least 4/5 in all planes. Baseline:  Goal status: INITIAL  4.  Patient will walk at least 1000' on level and unlevel surfaces with L achilles pain of <3/10 Baseline:  Goal status: INITIAL  5.  Patient will tolerate a full day of work with L achilles pain < 3/10 Baseline:  Goal status: INITIAL  6. Patient will climb up and down at least 12 steps using step over step with U rail, MI, L achilles pain < 3/10 Baseline:  Goal Status: INITIAL   7. Increase FOTO score to at least 65  Baseline:  Goal Status: INITIAL PLAN: PT FREQUENCY: 1-2x/week  PT DURATION: 10 weeks  PLANNED INTERVENTIONS: Therapeutic exercises, Therapeutic  activity, Neuromuscular re-education, Balance training, Gait training, Patient/Family education, Self Care, Joint mobilization, Stair training, Dry Needling, Electrical stimulation, Cryotherapy, Moist heat, Vasopneumatic device, Ultrasound, Ionotophoresis 4mg /ml Dexamethasone, and Manual therapy  PLAN FOR NEXT SESSION: STM, stretch, strengthening, update Hep to include resisted strengthening.   , DPT 07/06/2022, 9:33 AM

## 2022-07-13 ENCOUNTER — Encounter: Payer: Self-pay | Admitting: Physical Therapy

## 2022-07-13 ENCOUNTER — Ambulatory Visit: Payer: 59 | Admitting: Physical Therapy

## 2022-07-13 DIAGNOSIS — R600 Localized edema: Secondary | ICD-10-CM

## 2022-07-13 DIAGNOSIS — R2689 Other abnormalities of gait and mobility: Secondary | ICD-10-CM

## 2022-07-13 DIAGNOSIS — M6281 Muscle weakness (generalized): Secondary | ICD-10-CM | POA: Diagnosis not present

## 2022-07-13 DIAGNOSIS — R262 Difficulty in walking, not elsewhere classified: Secondary | ICD-10-CM

## 2022-07-13 DIAGNOSIS — R278 Other lack of coordination: Secondary | ICD-10-CM

## 2022-07-13 NOTE — Therapy (Signed)
OUTPATIENT PHYSICAL THERAPY LOWER EXTREMITY EVALUATION   Patient Name: Jacqueline Cohen MRN: [id number] DOB:1976-03-03, 46 y.o., female Today's Date: 07/13/2022   PT End of Session - 07/13/22 1032     Visit Number 4    Date for PT Re-Evaluation 08/31/22    PT Start Time 1015    PT Stop Time 1057    PT Time Calculation (min) 42 min    Activity Tolerance Patient tolerated treatment well    Behavior During Therapy WFL for tasks assessed/performed               Past Medical History:  Diagnosis Date   Diabetes mellitus    Hypercholesterolemia -   Palpitations    PCOS (polycystic ovarian syndrome)    History reviewed. No pertinent surgical history. There are no problems to display for this patient.   [facility]P: [facility]  REFERRING PROVIDER: Daws, Harlow Mares, MD   REFERRING DIAG: Achilles Tendonosis   THERAPY DIAG:  Muscle weakness (generalized)  Difficulty in walking, not elsewhere classified  Localized edema  Other abnormalities of gait and mobility  Other lack of coordination  Rationale for Evaluation and Treatment Rehabilitation  ONSET DATE: 06/02/2022   SUBJECTIVE:   SUBJECTIVE STATEMENT: I am doing good, no major changes. Resisted walking gave me a lot of burning and pain, but it felt OK. I get relief from the patch when its on but after it comes off the pain is back, ice helps more usually.   PERTINENT HISTORY: DM  PAIN:  Are you having pain? Yes: NPRS scale: 0/10 Pain location: L achilles Pain description: stretching Aggravating factors: first thing in the morning Relieving factors: warms up , also icing helps  FALLS:  Has patient fallen in last 6 months? 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  PLOF: Independent  PATIENT GOALS Get out of the boot, return to her normal self.   OBJECTIVE:   DIAGNOSTIC FINDINGS: Xray 03/08/22-No acute osseous abnormalities  PATIENT SURVEYS:  FOTO 43.8     POSTURE: No Significant postural limitations  PALPATION: Patient was TTP along entire calf, with increased discomfort at distal achilles tendon to distal/posterior calcaneus.   LOWER EXTREMITY ROM:  Passive ROM Right eval Left eval  Hip flexion    Hip extension    Hip abduction    Hip adduction    Hip internal rotation    Hip external rotation    Knee flexion    Knee extension    Ankle dorsiflexion  AROM 80 90 PROM  Ankle plantarflexion  38/30  Ankle inversion  WFL  Ankle eversion  WFL   (Blank rows = not tested)  LOWER EXTREMITY MMT: WNL BLE except L ankle approximately (2+)-(3-)/5 in all planes   GAIT: Distance walked: 45' Assistive device utilized: None Level of assistance: Complete Independence Comments: Wearing CAM Boot on L. Reports L hip pain, limited gait due to boot.    TODAY'S TREATMENT:  07/13/22  TherEx  4 way ankle strengthening green TB x15 each  Ankle alphabet x3 L LE  BAPS board x15 CW/CCW  SLS on blue foam pad 10x10 seconds  Heel walking in // bars x2 full laps Toe walking in // bars x2 full laps  Heel-toe walking with  blue foam pad under L LE x10    Ionto patch 4 hours medial L ankle       07/06/2022 Recumbent bike L2.5 x 6 min  L ankle PROM in all directions L ankle 4 way green x10 S2S 2x10  Resisted gait forward 20lb x3 ankle burning  Alt box taps 4in 2x10  Side step   Ionto Medial L ankle 4 hour patch   07/04/22 NuStep L5 x 6 minutes STM to L calf and achilles tendon followed by stretch to DF, toe flexion and ext, calcaneal inversion Active ankle strengthening-DF, PF, Inver, Ever 10 reps each against Red Tband resistance Ankle inversion eversion on a towel on the floor. 10  reps  Eval Patient education, AROM or L ankle in all planes   PATIENT EDUCATION:  Education details: Basic HEP, POC, begin to wean from the boot, starting in the house only, wearing tennis shoes. Progress as tolerated. Person educated: Patient Education method: Explanation, Demonstration, and Verbal cues Education comprehension: verbalized understanding and returned demonstration   HOME EXERCISE PROGRAM:  [id number]  ASSESSMENT:  CLINICAL IMPRESSION:  Jacqueline Cohen arrives doing OK today, sees her ortho tomorrow and eager to get out of her boot- encouraged respecting healing time and feedback from her body. Continued working on strength and ROM today and closed chain strengthening as able. Still having some stiffness in her ankle which is to be expected still using the CAM boot. Hopefully MD will be happy with her progress.    OBJECTIVE IMPAIRMENTS Abnormal gait, decreased balance, decreased coordination, decreased endurance, decreased mobility, difficulty walking, decreased ROM, decreased strength, increased fascial restrictions, impaired flexibility, and pain.   ACTIVITY LIMITATIONS bending, standing, squatting, sleeping, stairs, locomotion level, and caring for others  PARTICIPATION LIMITATIONS: cleaning, laundry, shopping, community activity, occupation, and yard work  PERSONAL FACTORS Past/current experiences and 1 comorbidity: DM  are also affecting patient's functional outcome.   REHAB POTENTIAL: Good  CLINICAL DECISION MAKING: Evolving/moderate complexity  EVALUATION COMPLEXITY: Moderate   GOALS: Goals reviewed with patient? Yes  SHORT TERM GOALS: Target date: 07/20/2022  I with basic HEP Baseline: Goal status: INITIAL  LONG TERM GOALS: Target date: 08/31/2022   I with final HEP Baseline:  Goal status: INITIAL  2.  Patient will achieve full, painfree A and PROM in L ankle. Baseline:  Goal status: INITIAL  3.  Patient will achieve L ankle strength of at least 4/5  in all planes. Baseline:  Goal status: INITIAL  4.  Patient will walk at least 1000' on level and unlevel surfaces with L achilles pain of <3/10 Baseline:  Goal status: INITIAL  5.  Patient will tolerate a full day of work with L achilles pain < 3/10 Baseline:  Goal status: INITIAL  6. Patient will climb up and down at least 12 steps using step over step with U rail, MI, L achilles pain < 3/10 Baseline:  Goal Status: INITIAL   7. Increase FOTO score to at least 65  Baseline:  Goal Status: INITIAL PLAN: PT FREQUENCY: 1-2x/week  PT DURATION: 10 weeks  PLANNED INTERVENTIONS: Therapeutic exercises, Therapeutic activity, Neuromuscular re-education, Balance training, Gait training, Patient/Family education, Self Care, Joint mobilization, Stair training, Dry Needling, Electrical stimulation, Cryotherapy, Moist heat, Vasopneumatic device, Ultrasound, Ionotophoresis 4mg /ml Dexamethasone, and Manual therapy  PLAN FOR NEXT SESSION: STM, stretch, strengthening, update Hep to include resisted strengthening.   PT DPT PN2  07/13/2022, 10:58 AM

## 2022-07-21 ENCOUNTER — Encounter: Payer: Self-pay | Admitting: Physical Therapy

## 2022-07-21 ENCOUNTER — Ambulatory Visit: Payer: 59 | Admitting: Physical Therapy

## 2022-07-21 DIAGNOSIS — M6281 Muscle weakness (generalized): Secondary | ICD-10-CM

## 2022-07-21 DIAGNOSIS — R262 Difficulty in walking, not elsewhere classified: Secondary | ICD-10-CM

## 2022-07-21 DIAGNOSIS — R2689 Other abnormalities of gait and mobility: Secondary | ICD-10-CM

## 2022-07-21 DIAGNOSIS — R278 Other lack of coordination: Secondary | ICD-10-CM

## 2022-07-21 DIAGNOSIS — R600 Localized edema: Secondary | ICD-10-CM

## 2022-07-21 NOTE — Therapy (Signed)
OUTPATIENT PHYSICAL THERAPY LOWER EXTREMITY EVALUATION   Patient Name: Jacqueline Cohen MRN: [id number] DOB:[birth year], 46 y.o., female Today's Date: 07/21/2022   PT End of Session - 07/21/22 0905     Visit Number 5    Date for PT Re-Evaluation 08/31/22    PT Start Time 0844    PT Stop Time 0926    PT Time Calculation (min) 42 min    Activity Tolerance Patient tolerated treatment well    Behavior During Therapy [facility] for tasks assessed/performed                Past Medical History:  Diagnosis Date   Diabetes mellitus    Hypercholesterolemia -   Palpitations    PCOS (polycystic ovarian syndrome)    History reviewed. No pertinent surgical history. There are no problems to display for this patient.   [facility]P: [facility]  REFERRING PROVIDER: Daws, Janetta Hora, MD   REFERRING DIAG: Achilles Tendonosis   THERAPY DIAG:  Muscle weakness (generalized)  Difficulty in walking, not elsewhere classified  Localized edema  Other abnormalities of gait and mobility  Other lack of coordination  Rationale for Evaluation and Treatment Rehabilitation  ONSET DATE: 06/02/2022   SUBJECTIVE:   SUBJECTIVE STATEMENT: Patient reports some increased pain and swelling in her achilles as she has been wearing her brace less. Continues to perform HEP and wants to increase resistance.  PERTINENT HISTORY: DM  PAIN:  Are you having pain? Yes: NPRS scale: 5/10 Pain location: L achilles Pain description: stretching Aggravating factors: first thing in the morning Relieving factors: warms up , also icing helps  FALLS:  Has patient fallen in last 6 months? 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  PLOF:  Independent  PATIENT GOALS Get out of the boot, return to her normal self.   OBJECTIVE:   DIAGNOSTIC FINDINGS: Xray 03/08/22-No acute osseous abnormalities  PATIENT SURVEYS:  FOTO 43.8     POSTURE: No Significant postural limitations  PALPATION: Patient was TTP along entire calf, with increased discomfort at distal achilles tendon to distal/posterior calcaneus.   LOWER EXTREMITY ROM:  Passive ROM Right eval Left eval  Hip flexion    Hip extension    Hip abduction    Hip adduction    Hip internal rotation    Hip external rotation    Knee flexion    Knee extension    Ankle dorsiflexion  AROM 80 90 PROM  Ankle plantarflexion  38/30  Ankle inversion  WFL  Ankle eversion  WFL   (Blank rows = not tested)  LOWER EXTREMITY MMT: WNL BLE except L ankle approximately (2+)-(3-)/5 in all planes   GAIT: Distance walked: 74' Assistive device utilized: None Level of assistance: Complete Independence Comments: Wearing CAM Boot on L. Reports L hip pain, limited gait due to boot.    TODAY'S TREATMENT: 07/21/22 NuStep L5 x 6 minutes STM to L calf and achilles including cross friction massage and education in foam rolling. Passive stretch to achilles in supine and in stand on step, Heel raises on step 2 x 10 reps LLE on physiodisc in parallel bars, DF/PF, inv/ever x 10 reps each Ionto to Medial L achilles x 4 hours  07/13/22 TherEx 4 way ankle strengthening green TB  x15 each  Ankle alphabet x3 L LE  BAPS board x15 CW/CCW  SLS on blue foam pad 10x10 seconds  Heel walking in // bars x2 full laps Toe walking in // bars x2 full laps  Heel-toe walking with blue foam pad under L LE x10    Ionto patch 4 hours medial L ankle       07/06/2022 Recumbent bike L2.5 x 6 min  L ankle PROM in all directions L ankle 4 way green x10 S2S 2x10  Resisted gait forward 20lb x3 ankle burning  Alt box taps 4in 2x10  Side step   Ionto Medial L ankle 4 hour patch   07/04/22 NuStep  L5 x 6 minutes STM to L calf and achilles tendon followed by stretch to DF, toe flexion and ext, calcaneal inversion Active ankle strengthening-DF, PF, Inver, Ever 10 reps each against Red Tband resistance Ankle inversion eversion on a towel on the floor. 10 reps  Eval Patient education, AROM or L ankle in all planes   PATIENT EDUCATION:  Education details: Basic HEP, POC, begin to wean from the boot, starting in the house only, wearing tennis shoes. Progress as tolerated. Person educated: Patient Education method: Explanation, Demonstration, and Verbal cues Education comprehension: verbalized understanding and returned demonstration   HOME EXERCISE PROGRAM:  [id number]  ASSESSMENT:  CLINICAL IMPRESSION: Patient reports some increaed soreness as her Dr wants her to wear her boot less. Treatment focused on STM for L calf and achilles as they remain tight. Educated in foam rolling, then moved to stretch and strenghtening, finished with Ionto patch  OBJECTIVE IMPAIRMENTS Abnormal gait, decreased balance, decreased coordination, decreased endurance, decreased mobility, difficulty walking, decreased ROM, decreased strength, increased fascial restrictions, impaired flexibility, and pain.   ACTIVITY LIMITATIONS bending, standing, squatting, sleeping, stairs, locomotion level, and caring for others  PARTICIPATION LIMITATIONS: cleaning, laundry, shopping, community activity, occupation, and yard work  PERSONAL FACTORS Past/current experiences and 1 comorbidity: DM  are also affecting patient's functional outcome.   REHAB POTENTIAL: Good  CLINICAL DECISION MAKING: Evolving/moderate complexity  EVALUATION COMPLEXITY: Moderate   GOALS: Goals reviewed with patient? Yes  SHORT TERM GOALS: Target date: 07/20/2022  I with basic HEP Baseline: Goal status: met  LONG TERM GOALS: Target date: 08/31/2022   I with final HEP Baseline:  Goal status: INITIAL  2.  Patient will achieve full,  painfree A and PROM in L ankle. Baseline:  Goal status: ongoing  3.  Patient will achieve L ankle strength of at least 4/5 in all planes. Baseline:  Goal status: INITIAL  4.  Patient will walk at least 1000' on level and unlevel surfaces with L achilles pain of <3/10 Baseline:  Goal status: INITIAL  5.  Patient will tolerate a full day of work with L achilles pain < 3/10 Baseline:  Goal status: ongoing  6. Patient will climb up and down at least 12 steps using step over step with U rail, MI, L achilles pain < 3/10 Baseline:  Goal Status: INITIAL   7. Increase FOTO score to at least 65  Baseline:  Goal Status: INITIAL PLAN: PT FREQUENCY: 1-2x/week  PT DURATION: 10 weeks  PLANNED INTERVENTIONS: Therapeutic exercises, Therapeutic activity, Neuromuscular re-education, Balance training, Gait training, Patient/Family education, Self Care, Joint mobilization, Stair training, Dry Needling, Electrical stimulation, Cryotherapy, Moist heat, Vasopneumatic device, Ultrasound, Ionotophoresis 78m/ml Dexamethasone, and Manual therapy  PLAN FOR NEXT SESSION: STM, stretch, strengthening, update Hep to include resisted strengthening.   SEthel RanaDPT 07/21/22 9:27  AM  07/21/2022, 9:27 AM

## 2022-07-28 ENCOUNTER — Encounter: Payer: Self-pay | Admitting: Physical Therapy

## 2022-07-28 ENCOUNTER — Ambulatory Visit: Payer: 59 | Attending: Orthopedic Surgery | Admitting: Physical Therapy

## 2022-07-28 DIAGNOSIS — M6281 Muscle weakness (generalized): Secondary | ICD-10-CM | POA: Insufficient documentation

## 2022-07-28 DIAGNOSIS — R278 Other lack of coordination: Secondary | ICD-10-CM | POA: Diagnosis present

## 2022-07-28 DIAGNOSIS — R600 Localized edema: Secondary | ICD-10-CM | POA: Diagnosis present

## 2022-07-28 DIAGNOSIS — R262 Difficulty in walking, not elsewhere classified: Secondary | ICD-10-CM | POA: Diagnosis present

## 2022-07-28 DIAGNOSIS — R2689 Other abnormalities of gait and mobility: Secondary | ICD-10-CM | POA: Diagnosis present

## 2022-07-28 NOTE — Therapy (Signed)
OUTPATIENT PHYSICAL THERAPY LOWER EXTREMITY EVALUATION   Patient Name: Jacqueline Cohen MRN: [id number] DOB:1975-12-31, 46 y.o., female Today's Date: 07/28/2022   PT End of Session - 07/28/22 0858     Visit Number 6    Date for PT Re-Evaluation 08/31/22    PT Start Time 0850    PT Stop Time 0928    PT Time Calculation (min) 38 min    Activity Tolerance Patient tolerated treatment well    Behavior During Therapy [facility] for tasks assessed/performed                 Past Medical History:  Diagnosis Date   Diabetes mellitus    Hypercholesterolemia -   Palpitations    PCOS (polycystic ovarian syndrome)    History reviewed. No pertinent surgical history. There are no problems to display for this patient.   [facility]P: [facility]  REFERRING PROVIDER: Daws, Janetta Hora, MD   REFERRING DIAG: Achilles Tendonosis   THERAPY DIAG:  Muscle weakness (generalized)  Difficulty in walking, not elsewhere classified  Localized edema  Other abnormalities of gait and mobility  Other lack of coordination  Rationale for Evaluation and Treatment Rehabilitation  ONSET DATE: 06/02/2022   SUBJECTIVE:   SUBJECTIVE STATEMENT: Patient reports continued pain and swelling. She is wearing her boot when going out to visits, but does not wear it otherwise.  PERTINENT HISTORY: DM  PAIN:  Are you having pain? Yes: NPRS scale: 8/10 Pain location: L achilles Pain description: stretching Aggravating factors: first thing in the morning Relieving factors: warms up , also icing helps  FALLS:  Has patient fallen in last 6 months? 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  PLOF: Independent  PATIENT GOALS Get out of  the boot, return to her normal self.   OBJECTIVE:   DIAGNOSTIC FINDINGS: Xray 03/08/22-No acute osseous abnormalities  PATIENT SURVEYS:  FOTO 43.8     POSTURE: No Significant postural limitations  PALPATION: Patient was TTP along entire calf, with increased discomfort at distal achilles tendon to distal/posterior calcaneus.   LOWER EXTREMITY ROM:  Passive ROM Right eval Left eval  Hip flexion    Hip extension    Hip abduction    Hip adduction    Hip internal rotation    Hip external rotation    Knee flexion    Knee extension    Ankle dorsiflexion  AROM 80 90 PROM  Ankle plantarflexion  38/30  Ankle inversion  WFL  Ankle eversion  WFL   (Blank rows = not tested)  LOWER EXTREMITY MMT: WNL BLE except L ankle approximately (2+)-(3-)/5 in all planes   GAIT: Distance walked: 39' Assistive device utilized: None Level of assistance: Complete Independence Comments: Wearing CAM Boot on L. Reports L hip pain, limited gait due to boot.    TODAY'S TREATMENT: 07/28/22 NuStep L5 x 6 minutes Heel raises x 10 on flat surface, then 10 reps on step Ankle inversion ball squeeze 2 x 10 reps STM to L calf and achilles with tennis ball F/B stretch, cross friction massage to achilles Heel walking/toe walking Ionto patch to medial L achilles tendon.   07/21/22 NuStep L5 x 6 minutes STM to L calf and achilles including cross friction massage and education in  foam rolling. Passive stretch to achilles in supine and in stand on step, Heel raises on step 2 x 10 reps LLE on physiodisc in parallel bars, DF/PF, inv/ever x 10 reps each Ionto to Medial L achilles x 4 hours  07/13/22 TherEx 4 way ankle strengthening green TB x15 each  Ankle alphabet x3 L LE  BAPS board x15 CW/CCW  SLS on blue foam pad 10x10 seconds  Heel walking in // bars x2 full laps Toe walking in // bars x2 full laps  Heel-toe walking with blue foam pad under L LE x10    Ionto patch 4 hours medial L ankle    07/06/2022 Recumbent bike L2.5 x 6 min  L ankle PROM in all directions L ankle 4 way green x10 S2S 2x10  Resisted gait forward 20lb x3 ankle burning  Alt box taps 4in 2x10  Side step   Ionto Medial L ankle 4 hour patch   07/04/22 NuStep L5 x 6 minutes STM to L calf and achilles tendon followed by stretch to DF, toe flexion and ext, calcaneal inversion Active ankle strengthening-DF, PF, Inver, Ever 10 reps each against Red Tband resistance Ankle inversion eversion on a towel on the floor. 10 reps  Eval Patient education, AROM or L ankle in all planes   PATIENT EDUCATION:  Education details: Basic HEP, POC, begin to wean from the boot, starting in the house only, wearing tennis shoes. Progress as tolerated. Person educated: Patient Education method: Explanation, Demonstration, and Verbal cues Education comprehension: verbalized understanding and returned demonstration   HOME EXERCISE PROGRAM:  [id number]  ASSESSMENT:  CLINICAL IMPRESSION: Patient reports continued soreness, fluctuates and is gone the next morning. Performed STM and stretch along with continued strengthening. Patient to try going to one visit for work without wearing the boot to assess toelrance.  OBJECTIVE IMPAIRMENTS Abnormal gait, decreased balance, decreased coordination, decreased endurance, decreased mobility, difficulty walking, decreased ROM, decreased strength, increased fascial restrictions, impaired flexibility, and pain.   ACTIVITY LIMITATIONS bending, standing, squatting, sleeping, stairs, locomotion level, and caring for others  PARTICIPATION LIMITATIONS: cleaning, laundry, shopping, community activity, occupation, and yard work  PERSONAL FACTORS Past/current experiences and 1 comorbidity: DM  are also affecting patient's functional outcome.   REHAB POTENTIAL: Good  CLINICAL DECISION MAKING: Evolving/moderate complexity  EVALUATION COMPLEXITY: Moderate   GOALS: Goals reviewed with  patient? Yes  SHORT TERM GOALS: Target date: 07/20/2022  I with basic HEP Baseline: Goal status: met  LONG TERM GOALS: Target date: 08/31/2022   I with final HEP Baseline:  Goal status: INITIAL  2.  Patient will achieve full, painfree A and PROM in L ankle. Baseline:  Goal status: ongoing  3.  Patient will achieve L ankle strength of at least 4/5 in all planes. Baseline:  Goal status: INITIAL  4.  Patient will walk at least 1000' on level and unlevel surfaces with L achilles pain of <3/10 Baseline:  Goal status: INITIAL  5.  Patient will tolerate a full day of work with L achilles pain < 3/10 Baseline:  Goal status: ongoing  6. Patient will climb up and down at least 12 steps using step over step with U rail, MI, L achilles pain < 3/10 Baseline:  Goal Status: INITIAL   7. Increase FOTO score to at least 65  Baseline:  Goal Status: INITIAL PLAN: PT FREQUENCY: 1-2x/week  PT DURATION: 10 weeks  PLANNED INTERVENTIONS: Therapeutic exercises, Therapeutic activity, Neuromuscular re-education, Balance training, Gait training, Patient/Family education, Self Care, Joint mobilization,  Stair training, Dry Needling, Electrical stimulation, Cryotherapy, Moist heat, Vasopneumatic device, Ultrasound, Ionotophoresis 74m/ml Dexamethasone, and Manual therapy  PLAN FOR NEXT SESSION: STM, stretch, strengthening, update Hep to include resisted strengthening.   SEthel RanaDPT 07/28/22 9:35 AM  07/28/2022, 9:35 AM

## 2022-08-08 ENCOUNTER — Ambulatory Visit: Payer: 59 | Admitting: Physical Therapy

## 2022-08-08 ENCOUNTER — Encounter: Payer: Self-pay | Admitting: Physical Therapy

## 2022-08-08 DIAGNOSIS — R600 Localized edema: Secondary | ICD-10-CM

## 2022-08-08 DIAGNOSIS — M6281 Muscle weakness (generalized): Secondary | ICD-10-CM

## 2022-08-08 DIAGNOSIS — R278 Other lack of coordination: Secondary | ICD-10-CM

## 2022-08-08 DIAGNOSIS — R262 Difficulty in walking, not elsewhere classified: Secondary | ICD-10-CM

## 2022-08-08 DIAGNOSIS — R2689 Other abnormalities of gait and mobility: Secondary | ICD-10-CM

## 2022-08-08 NOTE — Therapy (Signed)
OUTPATIENT PHYSICAL THERAPY LOWER EXTREMITY EVALUATION   Patient Name: Jacqueline Cohen MRN: [id number] DOB:Mar 23, 1976, 46 y.o., female Today's Date: 08/08/2022   PT End of Session - 08/08/22 1103     Visit Number 7    Date for PT Re-Evaluation 08/31/22    PT Start Time 1058    PT Stop Time 1140    PT Time Calculation (min) 42 min    Activity Tolerance Patient tolerated treatment well    Behavior During Therapy WFL for tasks assessed/performed                  Past Medical History:  Diagnosis Date   Diabetes mellitus    Hypercholesterolemia -   Palpitations    PCOS (polycystic ovarian syndrome)    History reviewed. No pertinent surgical history. There are no problems to display for this patient.   [facility]: [facility]  REFERRING PROVIDER: Daws, Janetta Hora, MD   REFERRING DIAG: Achilles Tendonosis   THERAPY DIAG:  Muscle weakness (generalized)  Difficulty in walking, not elsewhere classified  Localized edema  Other abnormalities of gait and mobility  Other lack of coordination  Rationale for Evaluation and Treatment Rehabilitation  ONSET DATE: 06/02/2022   SUBJECTIVE:   SUBJECTIVE STATEMENT: Patient reports that she has stopped wearing her boot. Overall, she is feeling better. She continues to have occasional shooting pain, but it does not last long. She is icing her leg much less frequently.  PERTINENT HISTORY: DM  PAIN:  Are you having pain? Yes: NPRS scale: 8/10 Pain location: L achilles Pain description: stretching Aggravating factors: first thing in the morning Relieving factors: warms up , also icing helps  FALLS:  Has patient fallen in last 6 months? 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  PLOF: Independent  PATIENT GOALS Get out of the boot, return to her normal self.   OBJECTIVE:   DIAGNOSTIC FINDINGS: Xray 03/08/22-No acute osseous abnormalities  PATIENT SURVEYS:  FOTO 43.8     POSTURE: No Significant postural limitations  PALPATION: Patient was TTP along entire calf, with increased discomfort at distal achilles tendon to distal/posterior calcaneus.   LOWER EXTREMITY ROM:  Passive ROM Right eval Left eval  Hip flexion    Hip extension    Hip abduction    Hip adduction    Hip internal rotation    Hip external rotation    Knee flexion    Knee extension    Ankle dorsiflexion  AROM 80 90 PROM  Ankle plantarflexion  38/30  Ankle inversion  WFL  Ankle eversion  WFL   (Blank rows = not tested)  LOWER EXTREMITY MMT: WNL BLE except L ankle approximately (2+)-(3-)/5 in all planes   GAIT: Distance walked: 24' Assistive device utilized: None Level of assistance: Complete Independence Comments: Wearing CAM Boot on L. Reports L hip pain, limited gait due to boot.    TODAY'S TREATMENT: 08/08/22 NuStep L5 x 7 minutes AROM L ankle DF/PF, circles in B directions x 10 each.  Resisted ankle DF, PF, inv and eversion with Blue Tband and ball., and 20# weight for PF L foot on physiodisc in parallel bars with BUE support for balance only, ankle mobilizations-PF/DF, Inv/ever slow, x 10 each STM with cross friction  to achilles on L and deep tissue mobilization to calf, emphasizing soleus, F/B passive stretch into DF with knee flexed and straight Vaso x 10 minutes to L foot and ankle.   07/28/22 NuStep L5 x 6 minutes Heel raises x 10 on flat surface, then 10 reps on step Ankle inversion ball squeeze 2 x 10 reps STM to L calf and achilles with tennis ball F/B stretch, cross friction massage to achilles Heel walking/toe walking Ionto patch to medial L achilles tendon.   07/21/22 NuStep L5 x 6 minutes STM to L calf and achilles  including cross friction massage and education in foam rolling. Passive stretch to achilles in supine and in stand on step, Heel raises on step 2 x 10 reps LLE on physiodisc in parallel bars, DF/PF, inv/ever x 10 reps each Ionto to Medial L achilles x 4 hours  07/13/22 TherEx 4 way ankle strengthening green TB x15 each  Ankle alphabet x3 L LE  BAPS board x15 CW/CCW  SLS on blue foam pad 10x10 seconds  Heel walking in // bars x2 full laps Toe walking in // bars x2 full laps  Heel-toe walking with blue foam pad under L LE x10    Ionto patch 4 hours medial L ankle   07/06/2022 Recumbent bike L2.5 x 6 min  L ankle PROM in all directions L ankle 4 way green x10 S2S 2x10  Resisted gait forward 20lb x3 ankle burning  Alt box taps 4in 2x10  Side step   Ionto Medial L ankle 4 hour patch   07/04/22 NuStep L5 x 6 minutes STM to L calf and achilles tendon followed by stretch to DF, toe flexion and ext, calcaneal inversion Active ankle strengthening-DF, PF, Inver, Ever 10 reps each against Red Tband resistance Ankle inversion eversion on a towel on the floor. 10 reps  Eval Patient education, AROM or L ankle in all planes   PATIENT EDUCATION:  Education details: Basic HEP, POC, begin to wean from the boot, starting in the house only, wearing tennis shoes. Progress as tolerated. Person educated: Patient Education method: Explanation, Demonstration, and Verbal cues Education comprehension: verbalized understanding and returned demonstration   HOME EXERCISE PROGRAM:  [id number]  ASSESSMENT:  CLINICAL IMPRESSION: Patient reports continued soreness, fluctuates and is gone the next morning. Performed STM and stretch along with continued strengthening. Patient to try going to one visit for work without wearing the boot to assess toelrance.  OBJECTIVE IMPAIRMENTS Abnormal gait, decreased balance, decreased coordination, decreased endurance, decreased mobility, difficulty walking,  decreased ROM, decreased strength, increased fascial restrictions, impaired flexibility, and pain.   ACTIVITY LIMITATIONS bending, standing, squatting, sleeping, stairs, locomotion level, and caring for others  PARTICIPATION LIMITATIONS: cleaning, laundry, shopping, community activity, occupation, and yard work  PERSONAL FACTORS Past/current experiences and 1 comorbidity: DM  are also affecting patient's functional outcome.   REHAB POTENTIAL: Good  CLINICAL DECISION MAKING: Evolving/moderate complexity  EVALUATION COMPLEXITY: Moderate   GOALS: Goals reviewed with patient? Yes  SHORT TERM GOALS: Target date: 07/20/2022  I with basic HEP Baseline: Goal status: met  LONG TERM GOALS: Target date: 08/31/2022   I with final HEP Baseline:  Goal status: INITIAL  2.  Patient will achieve full, painfree A and PROM in L ankle. Baseline:  Goal status: ongoing  3.  Patient will achieve L ankle strength of at least 4/5 in all planes. Baseline:  Goal status: INITIAL  4.  Patient will walk at least 1000' on level and unlevel surfaces with  L achilles pain of <3/10 Baseline:  Goal status: INITIAL  5.  Patient will tolerate a full day of work with L achilles pain < 3/10 Baseline:  Goal status: ongoing  6. Patient will climb up and down at least 12 steps using step over step with U rail, MI, L achilles pain < 3/10 Baseline:  Goal Status: INITIAL   7. Increase FOTO score to at least 65  Baseline:  Goal Status: INITIAL PLAN: PT FREQUENCY: 1-2x/week  PT DURATION: 10 weeks  PLANNED INTERVENTIONS: Therapeutic exercises, Therapeutic activity, Neuromuscular re-education, Balance training, Gait training, Patient/Family education, Self Care, Joint mobilization, Stair training, Dry Needling, Electrical stimulation, Cryotherapy, Moist heat, Vasopneumatic device, Ultrasound, Ionotophoresis 39m/ml Dexamethasone, and Manual therapy  PLAN FOR NEXT SESSION: STM, stretch, strengthening, update  Hep to include resisted strengthening.   SEthel RanaDPT 08/08/22 11:40 AM  08/08/2022, 11:40 AM

## 2022-08-16 ENCOUNTER — Ambulatory Visit: Payer: 59 | Admitting: Physical Therapy

## 2022-08-23 ENCOUNTER — Ambulatory Visit: Payer: 59 | Attending: Orthopedic Surgery | Admitting: Physical Therapy

## 2022-08-23 ENCOUNTER — Encounter: Payer: Self-pay | Admitting: Physical Therapy

## 2022-08-23 DIAGNOSIS — R2689 Other abnormalities of gait and mobility: Secondary | ICD-10-CM | POA: Insufficient documentation

## 2022-08-23 DIAGNOSIS — R600 Localized edema: Secondary | ICD-10-CM | POA: Insufficient documentation

## 2022-08-23 DIAGNOSIS — R278 Other lack of coordination: Secondary | ICD-10-CM | POA: Diagnosis present

## 2022-08-23 DIAGNOSIS — M6281 Muscle weakness (generalized): Secondary | ICD-10-CM | POA: Diagnosis present

## 2022-08-23 DIAGNOSIS — R262 Difficulty in walking, not elsewhere classified: Secondary | ICD-10-CM | POA: Insufficient documentation

## 2022-08-23 NOTE — Therapy (Signed)
OUTPATIENT PHYSICAL THERAPY LOWER EXTREMITY EVALUATION  PHYSICAL THERAPY DISCHARGE SUMMARY  Visits from Start of Care: 8  Current functional level related to goals / functional outcomes: All goals met   Remaining deficits: Needs to continue progression for strength and power   Education / Equipment: HEP   Patient agrees to discharge. Patient goals were met. Patient is being discharged due to meeting the stated rehab goals.   Patient Name: Safira Proffit MRN: [id number] DOB:30-Jun-1976, 46 y.o., female Today's Date: 08/23/2022   PT End of Session - 08/23/22 0848     Visit Number 8    Date for PT Re-Evaluation 08/31/22    PT Start Time 0842    PT Stop Time 0920    PT Time Calculation (min) 38 min    Activity Tolerance Patient tolerated treatment well    Behavior During Therapy [facility] for tasks assessed/performed                   Past Medical History:  Diagnosis Date   Diabetes mellitus    Hypercholesterolemia -   Palpitations    PCOS (polycystic ovarian syndrome)    History reviewed. No pertinent surgical history. There are no problems to display for this patient.   PCP: [facility]  REFERRING PROVIDER: Daws, Janetta Hora, MD   REFERRING DIAG: Achilles Tendonosis   THERAPY DIAG:  Muscle weakness (generalized)  Difficulty in walking, not elsewhere classified  Localized edema  Other abnormalities of gait and mobility  Other lack of coordination  Rationale for Evaluation and Treatment Rehabilitation  ONSET DATE: 06/02/2022   SUBJECTIVE:   SUBJECTIVE STATEMENT: Patient reports that she has stopped wearing her boot at all. Her achilles sometimes gets sore during the day, but improves with icing and is not sore in the morning.  PERTINENT HISTORY: DM  PAIN:  Are you having pain? Yes: NPRS scale: 8/10 Pain location: L achilles Pain description: stretching Aggravating factors: first thing in the morning Relieving factors:  warms up , also icing helps  FALLS:  Has patient fallen in last 6 months? 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  PLOF: Independent  PATIENT GOALS Get out of the boot, return to her normal self.   OBJECTIVE:   DIAGNOSTIC FINDINGS: Xray 03/08/22-No acute osseous abnormalities  PATIENT SURVEYS:  FOTO 43.8     POSTURE: No Significant postural limitations  PALPATION: Patient was TTP along entire calf, with increased discomfort at distal achilles tendon to distal/posterior calcaneus.   LOWER EXTREMITY ROM:  Passive ROM Right eval Left eval  Hip flexion    Hip extension    Hip abduction    Hip adduction    Hip internal rotation    Hip external rotation    Knee flexion    Knee extension    Ankle dorsiflexion  AROM 80 90 PROM  Ankle plantarflexion  38/30  Ankle inversion  WFL  Ankle eversion  WFL   (Blank rows = not tested)  LOWER EXTREMITY MMT: WNL BLE except L ankle approximately (2+)-(3-)/5 in all planes   GAIT: Distance walked: 48' Assistive device utilized: None Level of assistance: Complete Independence Comments: Wearing CAM Boot on L. Reports L hip pain, limited gait due to boot.    TODAY'S  TREATMENT: 08/23/22 NuStep, legs only, L5 x 6 minutes Step ups onto step for power Physiodisc in SLS with BUE support BOSU ball SLS, BUE support Heel raise on step double leg and single Lateral shuffling Re-assessed for D/C.  08/08/22 NuStep L5 x 7 minutes AROM L ankle DF/PF, circles in B directions x 10 each.  Resisted ankle DF, PF, inv and eversion with Blue Tband and ball., and 20# weight for PF L foot on physiodisc in parallel bars with BUE support for balance only, ankle mobilizations-PF/DF, Inv/ever  slow, x 10 each STM with cross friction to achilles on L and deep tissue mobilization to calf, emphasizing soleus, F/B passive stretch into DF with knee flexed and straight Vaso x 10 minutes to L foot and ankle.   07/28/22 NuStep L5 x 6 minutes Heel raises x 10 on flat surface, then 10 reps on step Ankle inversion ball squeeze 2 x 10 reps STM to L calf and achilles with tennis ball F/B stretch, cross friction massage to achilles Heel walking/toe walking Ionto patch to medial L achilles tendon.   07/21/22 NuStep L5 x 6 minutes STM to L calf and achilles including cross friction massage and education in foam rolling. Passive stretch to achilles in supine and in stand on step, Heel raises on step 2 x 10 reps LLE on physiodisc in parallel bars, DF/PF, inv/ever x 10 reps each Ionto to Medial L achilles x 4 hours  07/13/22 TherEx 4 way ankle strengthening green TB x15 each  Ankle alphabet x3 L LE  BAPS board x15 CW/CCW  SLS on blue foam pad 10x10 seconds  Heel walking in // bars x2 full laps Toe walking in // bars x2 full laps  Heel-toe walking with blue foam pad under L LE x10    Ionto patch 4 hours medial L ankle   07/06/2022 Recumbent bike L2.5 x 6 min  L ankle PROM in all directions L ankle 4 way green x10 S2S 2x10  Resisted gait forward 20lb x3 ankle burning  Alt box taps 4in 2x10  Side step   Ionto Medial L ankle 4 hour patch   07/04/22 NuStep L5 x 6 minutes STM to L calf and achilles tendon followed by stretch to DF, toe flexion and ext, calcaneal inversion Active ankle strengthening-DF, PF, Inver, Ever 10 reps each against Red Tband resistance Ankle inversion eversion on a towel on the floor. 10 reps  Eval Patient education, AROM or L ankle in all planes   PATIENT EDUCATION:  Education details: Basic HEP, POC, begin to wean from the boot, starting in the house only, wearing tennis shoes. Progress as tolerated. Person educated: Patient Education method:  Explanation, Demonstration, and Verbal cues Education comprehension: verbalized understanding and returned demonstration   HOME EXERCISE PROGRAM:  [id number]  ASSESSMENT:  CLINICAL IMPRESSION: Patient reports minimal soreness, increases by end of day, but eased with ice and rest, nothing above 2-3/10. Updated HEP to include more powerful activities, but cautioned to progress slowly and based upon soreness.  OBJECTIVE IMPAIRMENTS Abnormal gait, decreased balance, decreased coordination, decreased endurance, decreased mobility, difficulty walking, decreased ROM, decreased strength, increased fascial restrictions, impaired flexibility, and pain.   ACTIVITY LIMITATIONS bending, standing, squatting, sleeping, stairs, locomotion level, and caring for others  PARTICIPATION LIMITATIONS: cleaning, laundry, shopping, community activity, occupation, and yard work  PERSONAL FACTORS Past/current experiences and 1 comorbidity: DM  are also affecting patient's functional outcome.   REHAB POTENTIAL: Good  CLINICAL DECISION MAKING: Evolving/moderate complexity  EVALUATION COMPLEXITY:  Moderate   GOALS: Goals reviewed with patient? Yes  SHORT TERM GOALS: Target date: 07/20/2022  I with basic HEP Baseline: Goal status: met  LONG TERM GOALS: Target date: 08/31/2022   I with final HEP Baseline:  Goal status: met  2.  Patient will achieve full, painfree A and PROM in L ankle. Baseline:  Goal status: met  3.  Patient will achieve L ankle strength of at least 4/5 in all planes. Baseline:  Goal status: met  4.  Patient will walk at least 1000' on level and unlevel surfaces with L achilles pain of <3/10 Baseline:  Goal status: met  5.  Patient will tolerate a full day of work with L achilles pain < 3/10 Baseline:  Goal status: met  6. Patient will climb up and down at least 12 steps using step over step with U rail, MI, L achilles pain < 3/10 Baseline:  Goal Status: met   7. Increase  FOTO score to at least 65  Baseline:10/3-65  Goal Status: met PLAN: PT FREQUENCY: 1-2x/week  PT DURATION: 10 weeks  PLANNED INTERVENTIONS: Therapeutic exercises, Therapeutic activity, Neuromuscular re-education, Balance training, Gait training, Patient/Family education, Self Care, Joint mobilization, Stair training, Dry Needling, Electrical stimulation, Cryotherapy, Moist heat, Vasopneumatic device, Ultrasound, Ionotophoresis 57m/ml Dexamethasone, and Manual therapy  PLAN FOR NEXT SESSION: STM, stretch, strengthening, update Hep to include resisted strengthening.   SEthel RanaDPT 08/23/22 9:23 AM  08/23/2022, 9:23 AM

## 2022-12-19 ENCOUNTER — Other Ambulatory Visit (HOSPITAL_COMMUNITY): Payer: Self-pay | Admitting: Endocrinology

## 2022-12-19 DIAGNOSIS — E7800 Pure hypercholesterolemia, unspecified: Secondary | ICD-10-CM

## 2022-12-27 ENCOUNTER — Ambulatory Visit (HOSPITAL_COMMUNITY)
Admission: RE | Admit: 2022-12-27 | Discharge: 2022-12-27 | Disposition: A | Discharge status: Home / Self Care | Payer: 59 | Source: Ambulatory Visit | Attending: Endocrinology | Admitting: Endocrinology

## 2022-12-27 DIAGNOSIS — E7800 Pure hypercholesterolemia, unspecified: Secondary | ICD-10-CM | POA: Insufficient documentation

## 2024-12-25 ENCOUNTER — Encounter: Payer: Self-pay | Admitting: *Deleted

## 2024-12-25 ENCOUNTER — Telehealth: Payer: Self-pay | Admitting: *Deleted

## 2024-12-25 DIAGNOSIS — M2042 Other hammer toe(s) (acquired), left foot: Secondary | ICD-10-CM

## 2024-12-25 DIAGNOSIS — M2041 Other hammer toe(s) (acquired), right foot: Secondary | ICD-10-CM

## 2024-12-25 NOTE — Telephone Encounter (Signed)
 I left a message for the patient asking her to come about 20 minutes early for her appointment because we need xrays of her feet.  I asked her to stop by imaging first then they will direct her to us .  I asked her to enter the building via the back entrance.

## 2024-12-26 ENCOUNTER — Encounter: Payer: Self-pay | Admitting: Podiatry

## 2024-12-26 ENCOUNTER — Ambulatory Visit: Admitting: Podiatry

## 2024-12-26 ENCOUNTER — Ambulatory Visit

## 2024-12-26 DIAGNOSIS — E1142 Type 2 diabetes mellitus with diabetic polyneuropathy: Secondary | ICD-10-CM

## 2024-12-26 DIAGNOSIS — M2041 Other hammer toe(s) (acquired), right foot: Secondary | ICD-10-CM

## 2024-12-26 DIAGNOSIS — M2042 Other hammer toe(s) (acquired), left foot: Secondary | ICD-10-CM

## 2024-12-26 NOTE — Progress Notes (Signed)
"  °  Subjective:  Patient ID: Jacqueline Cohen, female    DOB: 1976-06-02,   MRN: [id number]  Chief Complaint  Patient presents with   Diabetes    These three toes on my left foot are very tender and sore to the touch.  They were red and swollen.  They even felt numb a little bit.  I'm Diabetic so I want to have it checked out.  It actually is like that on both feet.    49 y.o. female presents for concern of swelling and tenderness in a few toes on bilateral feet. They have been red and feel numb at times.  She relates this worsened after being out in the snow in different shoes that she did not 1 before.  She did relate an area of swelling and itching as well.  She does relate the toes had gotten red.  She relates currently they are doing better and not having any pain in particular today.  Relates burning and tingling in their feet. Patient is diabetic and last A1c was 7.3.  PCP:  Leonce Sink, MD    . Denies any other pedal complaints. Denies n/v/f/c.   Past Medical History:  Diagnosis Date   Diabetes mellitus    Hypercholesterolemia -   Palpitations    PCOS (polycystic ovarian syndrome)     Objective:  Physical Exam: Vascular: DP/PT pulses 2/4 bilateral. CFT <3 seconds. Normal hair growth on digits. No edema.  Skin. No lacerations or abrasions bilateral feet.  Nails 1 through 5 normal in appearance.  No major edema or erythema noted. Musculoskeletal: MMT 5/5 bilateral lower extremities in DF, PF, Inversion and Eversion. Deceased ROM in DF of ankle joint.  Hammer digits 2 through 4 bilateral.  No tenderness to palpation.  No pain with range of motion of the digits.  No obvious defect other deformities noted. Neurological: Sensation intact to light touch.   Assessment:   1. Type 2 diabetes mellitus with peripheral neuropathy (HCC)   2. Hammertoe of right foot   3. Hammertoe of left foot      Plan:  Patient was evaluated and treated and all questions answered. -Discussed and  educated patient on diabetic foot care, especially with  regards to the vascular, neurological and musculoskeletal systems.  -Stressed the importance of good glycemic control and the detriment of not  controlling glucose levels in relation to the foot. -Discussed supportive shoes at all times and checking feet regularly.  -X-rays reviewed.  No acute fractures or dislocations noted.  Hammering of digits 2 through 4 bilateral. -Educated on hammertoes and treatment options  -Discussed possibility of Raynaud's phenomenon as well as a possible cause for redness and irritation around the toes after being out in the cold.  Difficult to say as she is improved currently. -Discussed padding including toe caps and crest pads.  -Discussed need for potential surgery if pain does not improved.  -Patient to follow-up 1 year for diabetic foot check. Discussed calling if any changes or increased pain.    Asberry Failing, DPM    "
